# Patient Record
Sex: Female | Born: 1955 | Race: White | Hispanic: No | Marital: Married | Smoking: Never smoker
Health system: Southern US, Community
[De-identification: ages and names within clinical notes are randomized; demographics above are authoritative.]

## PROBLEM LIST (undated history)

## (undated) DIAGNOSIS — E785 Hyperlipidemia, unspecified: Secondary | ICD-10-CM

## (undated) DIAGNOSIS — Z800 Family history of malignant neoplasm of digestive organs: Secondary | ICD-10-CM

## (undated) DIAGNOSIS — N951 Menopausal and female climacteric states: Secondary | ICD-10-CM

## (undated) DIAGNOSIS — E041 Nontoxic single thyroid nodule: Secondary | ICD-10-CM

## (undated) HISTORY — PX: COLONOSCOPY: SHX174

## (undated) HISTORY — DX: Family history of malignant neoplasm of digestive organs: Z80.0

## (undated) HISTORY — PX: TONSILLECTOMY: SUR1361

## (undated) HISTORY — DX: Hyperlipidemia, unspecified: E78.5

## (undated) HISTORY — DX: Menopausal and female climacteric states: N95.1

## (undated) HISTORY — PX: OTHER SURGICAL HISTORY: SHX169

## (undated) HISTORY — DX: Nontoxic single thyroid nodule: E04.1

---

## 1998-02-16 ENCOUNTER — Other Ambulatory Visit: Admission: RE | Admit: 1998-02-16 | Discharge: 1998-02-16 | Payer: Self-pay | Admitting: Obstetrics and Gynecology

## 1999-06-18 ENCOUNTER — Other Ambulatory Visit: Admission: RE | Admit: 1999-06-18 | Discharge: 1999-06-18 | Payer: Self-pay | Admitting: Obstetrics and Gynecology

## 1999-08-28 ENCOUNTER — Encounter: Admission: RE | Admit: 1999-08-28 | Discharge: 1999-08-28 | Payer: Self-pay | Admitting: Obstetrics and Gynecology

## 1999-08-28 ENCOUNTER — Encounter: Payer: Self-pay | Admitting: Obstetrics and Gynecology

## 2000-06-26 ENCOUNTER — Other Ambulatory Visit: Admission: RE | Admit: 2000-06-26 | Discharge: 2000-06-26 | Payer: Self-pay | Admitting: Obstetrics and Gynecology

## 2000-09-02 ENCOUNTER — Encounter: Payer: Self-pay | Admitting: Obstetrics and Gynecology

## 2000-09-02 ENCOUNTER — Encounter: Admission: RE | Admit: 2000-09-02 | Discharge: 2000-09-02 | Payer: Self-pay | Admitting: Obstetrics and Gynecology

## 2001-09-03 ENCOUNTER — Encounter: Admission: RE | Admit: 2001-09-03 | Discharge: 2001-09-03 | Payer: Self-pay | Admitting: Internal Medicine

## 2001-09-03 ENCOUNTER — Encounter: Payer: Self-pay | Admitting: Obstetrics and Gynecology

## 2002-09-06 ENCOUNTER — Encounter: Admission: RE | Admit: 2002-09-06 | Discharge: 2002-09-06 | Payer: Self-pay | Admitting: Obstetrics and Gynecology

## 2002-09-06 ENCOUNTER — Encounter: Payer: Self-pay | Admitting: Obstetrics and Gynecology

## 2003-01-08 DIAGNOSIS — E041 Nontoxic single thyroid nodule: Secondary | ICD-10-CM

## 2003-01-08 HISTORY — PX: OTHER SURGICAL HISTORY: SHX169

## 2003-01-08 HISTORY — DX: Nontoxic single thyroid nodule: E04.1

## 2003-03-21 ENCOUNTER — Encounter: Payer: Self-pay | Admitting: Internal Medicine

## 2003-07-14 ENCOUNTER — Ambulatory Visit (HOSPITAL_COMMUNITY): Admission: RE | Admit: 2003-07-14 | Discharge: 2003-07-14 | Payer: Self-pay | Admitting: Obstetrics and Gynecology

## 2003-07-19 ENCOUNTER — Ambulatory Visit (HOSPITAL_COMMUNITY): Admission: RE | Admit: 2003-07-19 | Discharge: 2003-07-19 | Payer: Self-pay | Admitting: Obstetrics and Gynecology

## 2003-07-19 ENCOUNTER — Encounter (INDEPENDENT_AMBULATORY_CARE_PROVIDER_SITE_OTHER): Payer: Self-pay | Admitting: *Deleted

## 2003-08-22 ENCOUNTER — Observation Stay (HOSPITAL_COMMUNITY): Admission: RE | Admit: 2003-08-22 | Discharge: 2003-08-23 | Payer: Self-pay | Admitting: Surgery

## 2003-08-22 ENCOUNTER — Encounter (INDEPENDENT_AMBULATORY_CARE_PROVIDER_SITE_OTHER): Payer: Self-pay | Admitting: Specialist

## 2003-09-28 ENCOUNTER — Encounter: Admission: RE | Admit: 2003-09-28 | Discharge: 2003-09-28 | Payer: Self-pay | Admitting: Obstetrics and Gynecology

## 2004-06-20 ENCOUNTER — Ambulatory Visit: Payer: Self-pay | Admitting: Internal Medicine

## 2005-01-15 ENCOUNTER — Encounter: Admission: RE | Admit: 2005-01-15 | Discharge: 2005-01-15 | Payer: Self-pay | Admitting: Obstetrics and Gynecology

## 2005-07-03 ENCOUNTER — Ambulatory Visit: Payer: Self-pay | Admitting: Internal Medicine

## 2005-07-31 ENCOUNTER — Ambulatory Visit: Payer: Self-pay | Admitting: Internal Medicine

## 2005-09-11 ENCOUNTER — Ambulatory Visit: Payer: Self-pay | Admitting: Internal Medicine

## 2006-01-16 ENCOUNTER — Encounter: Admission: RE | Admit: 2006-01-16 | Discharge: 2006-01-16 | Payer: Self-pay | Admitting: Obstetrics and Gynecology

## 2006-01-24 ENCOUNTER — Encounter: Admission: RE | Admit: 2006-01-24 | Discharge: 2006-01-24 | Payer: Self-pay | Admitting: Obstetrics and Gynecology

## 2006-08-05 ENCOUNTER — Ambulatory Visit: Payer: Self-pay | Admitting: Internal Medicine

## 2006-08-05 DIAGNOSIS — E785 Hyperlipidemia, unspecified: Secondary | ICD-10-CM

## 2006-08-10 ENCOUNTER — Encounter: Payer: Self-pay | Admitting: Internal Medicine

## 2006-08-11 ENCOUNTER — Encounter (INDEPENDENT_AMBULATORY_CARE_PROVIDER_SITE_OTHER): Payer: Self-pay | Admitting: *Deleted

## 2007-01-22 ENCOUNTER — Encounter: Admission: RE | Admit: 2007-01-22 | Discharge: 2007-01-22 | Payer: Self-pay | Admitting: Obstetrics and Gynecology

## 2007-03-16 ENCOUNTER — Ambulatory Visit: Payer: Self-pay | Admitting: Internal Medicine

## 2007-08-24 ENCOUNTER — Encounter (INDEPENDENT_AMBULATORY_CARE_PROVIDER_SITE_OTHER): Payer: Self-pay | Admitting: *Deleted

## 2007-08-24 ENCOUNTER — Ambulatory Visit: Payer: Self-pay | Admitting: Internal Medicine

## 2007-08-24 DIAGNOSIS — Z8639 Personal history of other endocrine, nutritional and metabolic disease: Secondary | ICD-10-CM

## 2007-08-31 ENCOUNTER — Encounter (INDEPENDENT_AMBULATORY_CARE_PROVIDER_SITE_OTHER): Payer: Self-pay | Admitting: *Deleted

## 2007-09-21 ENCOUNTER — Telehealth (INDEPENDENT_AMBULATORY_CARE_PROVIDER_SITE_OTHER): Payer: Self-pay | Admitting: *Deleted

## 2008-02-16 ENCOUNTER — Encounter: Admission: RE | Admit: 2008-02-16 | Discharge: 2008-02-16 | Payer: Self-pay | Admitting: Obstetrics and Gynecology

## 2008-03-08 ENCOUNTER — Telehealth (INDEPENDENT_AMBULATORY_CARE_PROVIDER_SITE_OTHER): Payer: Self-pay | Admitting: *Deleted

## 2008-09-19 ENCOUNTER — Ambulatory Visit: Payer: Self-pay | Admitting: Internal Medicine

## 2008-09-21 ENCOUNTER — Encounter: Admission: RE | Admit: 2008-09-21 | Discharge: 2008-09-21 | Payer: Self-pay | Admitting: Internal Medicine

## 2008-09-22 ENCOUNTER — Encounter (INDEPENDENT_AMBULATORY_CARE_PROVIDER_SITE_OTHER): Payer: Self-pay | Admitting: *Deleted

## 2008-09-22 ENCOUNTER — Telehealth (INDEPENDENT_AMBULATORY_CARE_PROVIDER_SITE_OTHER): Payer: Self-pay | Admitting: *Deleted

## 2008-09-26 ENCOUNTER — Telehealth (INDEPENDENT_AMBULATORY_CARE_PROVIDER_SITE_OTHER): Payer: Self-pay | Admitting: *Deleted

## 2008-09-27 ENCOUNTER — Ambulatory Visit: Payer: Self-pay | Admitting: Internal Medicine

## 2008-12-06 ENCOUNTER — Telehealth (INDEPENDENT_AMBULATORY_CARE_PROVIDER_SITE_OTHER): Payer: Self-pay | Admitting: *Deleted

## 2008-12-06 ENCOUNTER — Ambulatory Visit: Payer: Self-pay | Admitting: Internal Medicine

## 2008-12-13 LAB — CONVERTED CEMR LAB
ALT: 10 units/L (ref 0–35)
AST: 20 units/L (ref 0–37)
Albumin: 3.4 g/dL — ABNORMAL LOW (ref 3.5–5.2)
Direct LDL: 113.2 mg/dL
HDL: 87.7 mg/dL (ref >39.00)
Total Bilirubin: 0.6 mg/dL (ref 0.3–1.2)
Total Protein: 6.2 g/dL (ref 6.0–8.3)
Triglycerides: 54 mg/dL (ref 0.0–149.0)
VLDL: 10.8 mg/dL (ref 0.0–40.0)

## 2009-01-26 ENCOUNTER — Encounter: Payer: Self-pay | Admitting: Internal Medicine

## 2009-02-16 ENCOUNTER — Encounter: Admission: RE | Admit: 2009-02-16 | Discharge: 2009-02-16 | Payer: Self-pay | Admitting: Obstetrics and Gynecology

## 2009-02-16 IMAGING — MG MM DIGITAL SCREENING
4 series · 4 of 4 positions shown · non-contrast
Comparison: none

DG SCREEN MAMMOGRAM BILATERAL
Bilateral CC and MLO view(s) were taken.

DIGITAL SCREENING MAMMOGRAM WITH CAD:
The breast tissue is extremely dense.  No masses or malignant type calcifications are identified.  
Compared with prior studies.
Images were processed with CAD.

[R CC]
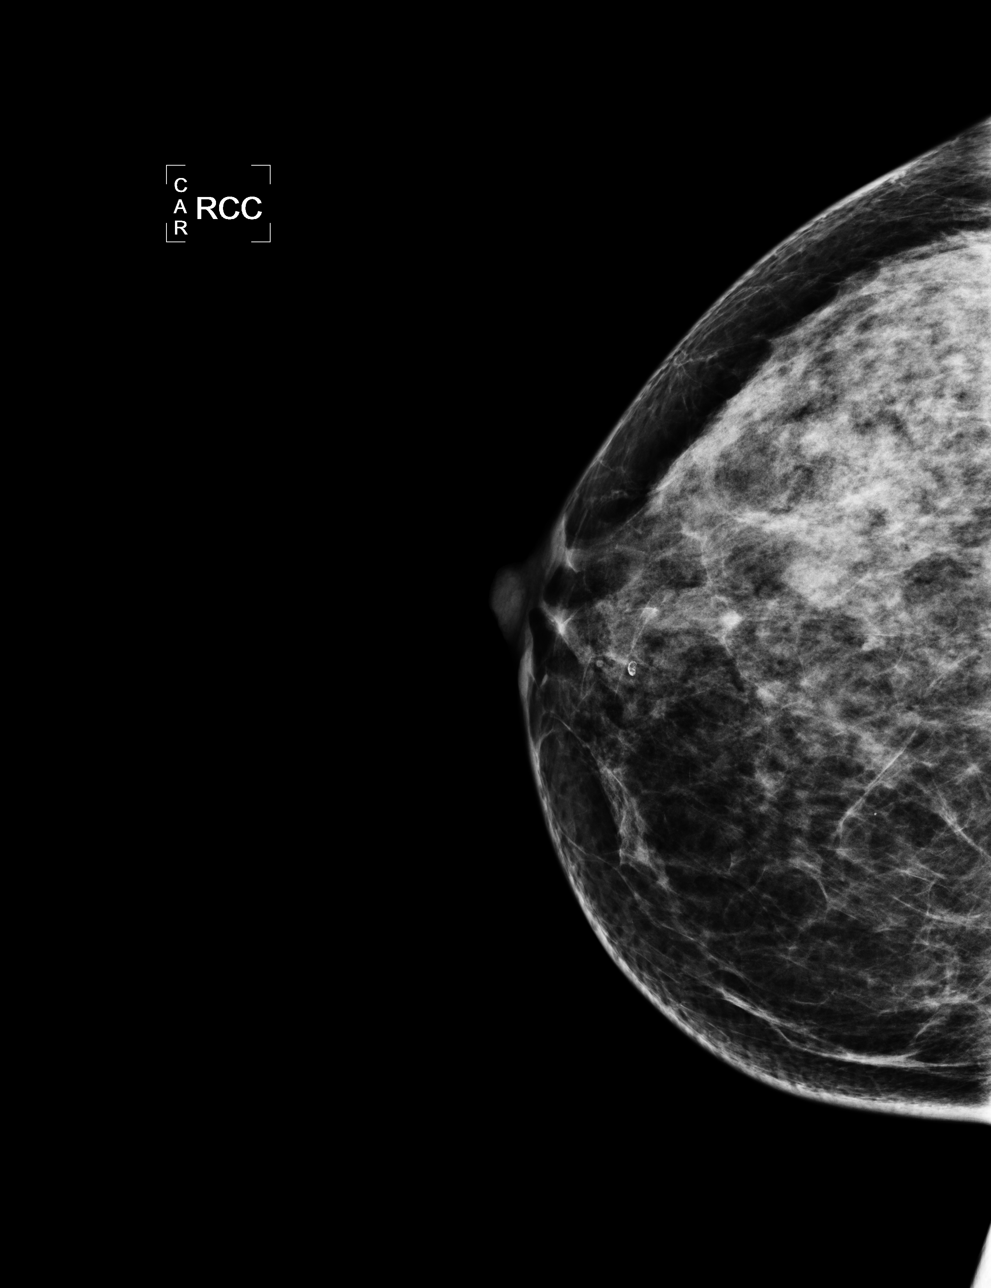

[L CC]
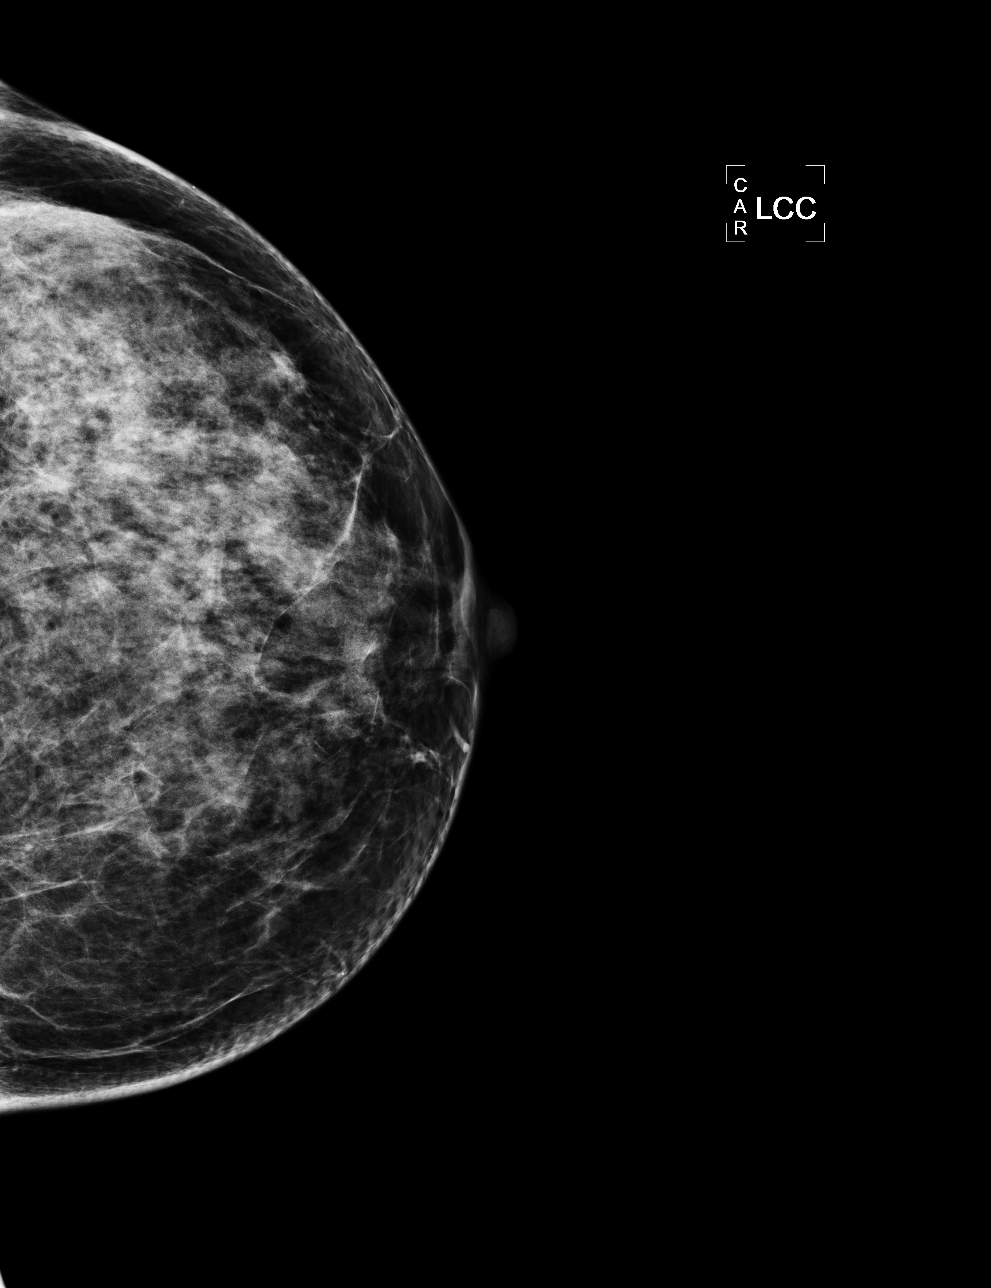

[L MLO]
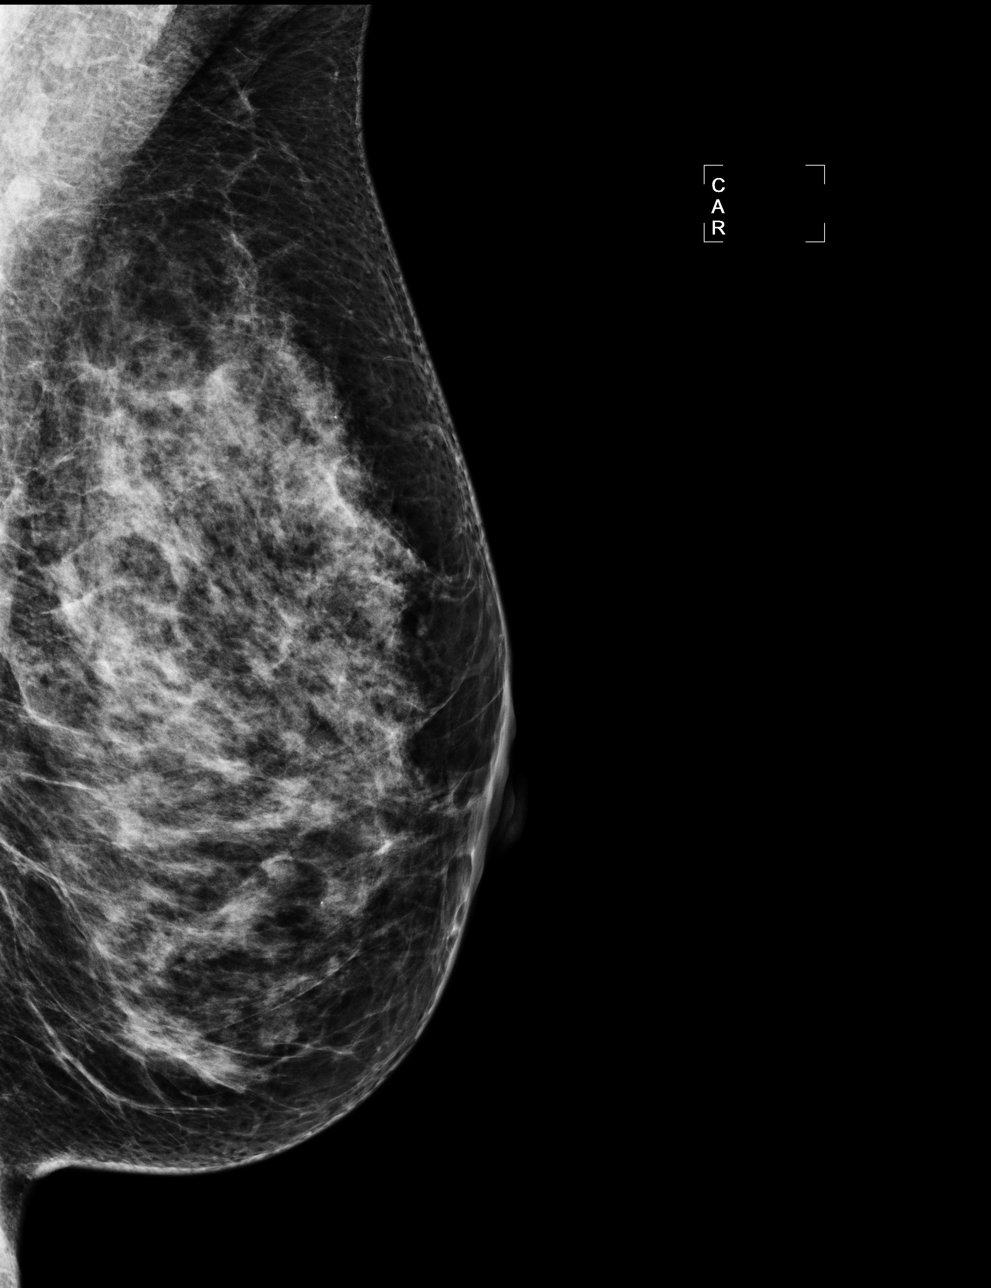

[R MLO]
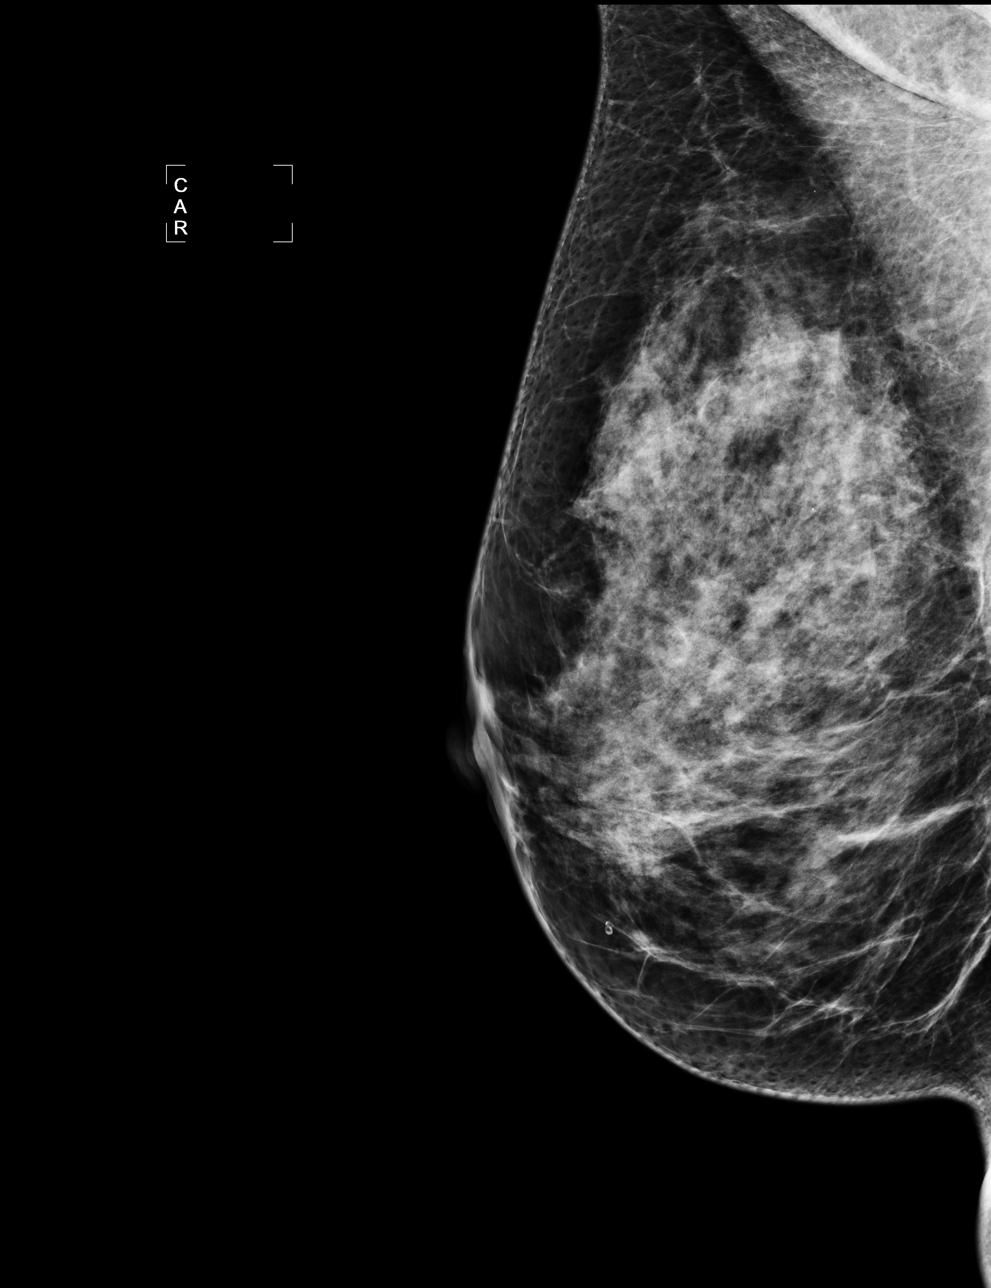

[4 of 4 positions shown; findings below may reference images not displayed]

IMPRESSION: No specific mammographic evidence of malignancy.  Next screening mammogram is recommended in one 
year.

A result letter of this screening mammogram will be mailed directly to the patient.

ASSESSMENT: Negative - BI-RADS 1

Screening mammogram in 1 year.
,

## 2009-10-04 ENCOUNTER — Ambulatory Visit: Payer: Self-pay | Admitting: Internal Medicine

## 2009-10-04 ENCOUNTER — Encounter: Payer: Self-pay | Admitting: Internal Medicine

## 2009-10-04 DIAGNOSIS — N951 Menopausal and female climacteric states: Secondary | ICD-10-CM | POA: Insufficient documentation

## 2009-10-05 LAB — CONVERTED CEMR LAB
ALT: 17 units/L (ref 0–35)
AST: 22 units/L (ref 0–37)
Alkaline Phosphatase: 55 units/L (ref 39–117)
Basophils Absolute: 0 10*3/uL (ref 0.0–0.1)
Basophils Relative: 0.7 % (ref 0.0–3.0)
CO2: 30 meq/L (ref 19–32)
Calcium: 8.9 mg/dL (ref 8.4–10.5)
Chloride: 102 meq/L (ref 96–112)
Creatinine, Ser: 0.7 mg/dL (ref 0.4–1.2)
Eosinophils Relative: 1.6 % (ref 0.0–5.0)
GFR calc non Af Amer: 96.83 mL/min (ref >60)
HCT: 39.7 % (ref 36.0–46.0)
Hemoglobin: 13.4 g/dL (ref 12.0–15.0)
MCV: 91.7 fL (ref 78.0–100.0)
Platelets: 302 10*3/uL (ref 150.0–400.0)
RBC: 4.32 M/uL (ref 3.87–5.11)
RDW: 13.4 % (ref 11.5–14.6)
Total Bilirubin: 0.5 mg/dL (ref 0.3–1.2)
Total CHOL/HDL Ratio: 3
Triglycerides: 79 mg/dL (ref 0.0–149.0)
WBC: 3.9 10*3/uL — ABNORMAL LOW (ref 4.5–10.5)

## 2009-10-16 ENCOUNTER — Telehealth (INDEPENDENT_AMBULATORY_CARE_PROVIDER_SITE_OTHER): Payer: Self-pay | Admitting: *Deleted

## 2010-01-27 ENCOUNTER — Other Ambulatory Visit: Payer: Self-pay | Admitting: Obstetrics and Gynecology

## 2010-01-27 DIAGNOSIS — Z1239 Encounter for other screening for malignant neoplasm of breast: Secondary | ICD-10-CM

## 2010-01-28 ENCOUNTER — Encounter: Payer: Self-pay | Admitting: Obstetrics and Gynecology

## 2010-02-04 LAB — CONVERTED CEMR LAB
ALT: 11 units/L (ref 0–35)
ALT: 12 units/L (ref 0–35)
AST: 11 units/L (ref 0–37)
AST: 18 units/L (ref 0–37)
Albumin: 3.2 g/dL — ABNORMAL LOW (ref 3.5–5.2)
Alkaline Phosphatase: 48 units/L (ref 39–117)
BUN: 7 mg/dL (ref 6–23)
BUN: 7 mg/dL (ref 6–23)
Basophils Absolute: 0 10*3/uL (ref 0.0–0.1)
Basophils Absolute: 0 10*3/uL (ref 0.0–0.1)
Basophils Absolute: 0 10*3/uL (ref 0.0–0.1)
Basophils Relative: 0.6 % (ref 0.0–1.0)
Basophils Relative: 0.6 % (ref 0.0–3.0)
Bilirubin Urine: NEGATIVE
Bilirubin, Direct: 0 mg/dL (ref 0.0–0.3)
Bilirubin, Direct: 0.1 mg/dL (ref 0.0–0.3)
CO2: 30 meq/L (ref 19–32)
CO2: 31 meq/L (ref 19–32)
Calcium: 8.5 mg/dL (ref 8.4–10.5)
Calcium: 8.9 mg/dL (ref 8.4–10.5)
Chloride: 102 meq/L (ref 96–112)
Cholesterol: 268 mg/dL (ref 0–200)
Cholesterol: 297 mg/dL — ABNORMAL HIGH (ref 0–200)
Creatinine, Ser: 0.8 mg/dL (ref 0.4–1.2)
Eosinophils Absolute: 0 10*3/uL (ref 0.0–0.6)
Eosinophils Absolute: 0.1 10*3/uL (ref 0.0–0.7)
Eosinophils Absolute: 0.1 10*3/uL (ref 0.0–0.7)
Eosinophils Relative: 0.7 % (ref 0.0–5.0)
Eosinophils Relative: 1.2 % (ref 0.0–5.0)
GFR calc Af Amer: 113 mL/min
GFR calc Af Amer: 113 mL/min
GFR calc non Af Amer: 93 mL/min
Glucose, Bld: 87 mg/dL (ref 70–99)
Glucose, Bld: 88 mg/dL (ref 70–99)
HCT: 38 % (ref 36.0–46.0)
HCT: 42.3 % (ref 36.0–46.0)
HDL: 66.5 mg/dL (ref >39.0)
HDL: 84.5 mg/dL (ref >39.00)
Hemoglobin: 13.2 g/dL (ref 12.0–15.0)
Hemoglobin: 13.8 g/dL (ref 12.0–15.0)
LDL Goal: 130 mg/dL
Leukocytes, UA: NEGATIVE
Lymphocytes Relative: 30.1 % (ref 12.0–46.0)
Lymphocytes Relative: 32.8 % (ref 12.0–46.0)
MCHC: 33.6 g/dL (ref 30.0–36.0)
MCV: 86.1 fL (ref 78.0–100.0)
MCV: 88.7 fL (ref 78.0–100.0)
MCV: 89.2 fL (ref 78.0–100.0)
Monocytes Relative: 7.7 % (ref 3.0–12.0)
Monocytes Relative: 9.8 % (ref 3.0–12.0)
Neutrophils Relative %: 55.4 % (ref 43.0–77.0)
Nitrite: NEGATIVE
Platelets: 279 10*3/uL (ref 150.0–400.0)
Platelets: 318 10*3/uL (ref 150–400)
Potassium: 3.8 meq/L (ref 3.5–5.1)
Potassium: 4 meq/L (ref 3.5–5.1)
Potassium: 4.1 meq/L (ref 3.5–5.1)
RBC: 4.77 M/uL (ref 3.87–5.11)
RDW: 12.8 % (ref 11.5–14.6)
Sodium: 137 meq/L (ref 135–145)
TSH: 1.48 microintl units/mL (ref 0.35–5.50)
Total Bilirubin: 0.9 mg/dL (ref 0.3–1.2)
Total CHOL/HDL Ratio: 3.1
Total Protein: 5.9 g/dL — ABNORMAL LOW (ref 6.0–8.3)
Total Protein: 5.9 g/dL — ABNORMAL LOW (ref 6.0–8.3)
Triglycerides: 72 mg/dL (ref 0.0–149.0)
Urine Glucose: NEGATIVE mg/dL
VLDL: 14.4 mg/dL (ref 0.0–40.0)
VLDL: 16 mg/dL (ref 0–40)
VLDL: 21 mg/dL (ref 0–40)
pH: 7 (ref 5.0–8.0)

## 2010-02-06 NOTE — Progress Notes (Signed)
Summary: Questions about meds  Phone Note Call from Patient   Caller: Patient Details for Reason: Questions about results Summary of Call: spk wit pt and she rcv'd results, wanted to know if she should continue meds. Adv yes to continue meds as directed until further directed by Dr.Hopper. Pt voiced understanding. Initial call taken by: Almeta Monas CMA Duncan Dull),  October 16, 2009 3:03 PM

## 2010-02-06 NOTE — Consult Note (Signed)
Summary: [facility]  [facility]   Imported By: Lanelle Bal 02/03/2009 09:42:28  _____________________________________________________________________  External Attachment:    Type:   Image     Comment:   External Document

## 2010-02-06 NOTE — Assessment & Plan Note (Signed)
Summary: CPX & LAB/CBS   Vital Signs:  Patient profile:   55 year old female Height:      65 inches Weight:      132.6 pounds BMI:     22.15 Temp:     98.5 degrees F oral Pulse rate:   76 / minute Resp:     14 per minute BP sitting:   100 / 64  (left arm) Cuff size:   regular  Vitals Entered By: Terri Pena CMA (October 04, 2009 9:52 AM)    History of Present Illness:    Terri Pena is here for a physical; she is essentially asymptomatic.    She also  presents for Hyperlipidemia follow-up.  The patient denies muscle aches, GI upset, abdominal pain, flushing, itching, constipation, diarrhea, and fatigue.  The patient denies the following symptoms: chest pain/pressure, exercise intolerance, dypsnea, palpitations, syncope, and pedal edema.  Compliance with medications (by patient report) has been near 100%.  Dietary compliance has been good.    Lipid Management History:      Positive NCEP/ATP III risk factors include female age 31 years old or older.  Negative NCEP/ATP III risk factors include non-diabetic, HDL cholesterol greater than 60, no family history for ischemic heart disease, non-tobacco-user status, non-hypertensive, no ASHD (atherosclerotic heart disease), no prior stroke/TIA, no peripheral vascular disease, and no history of aortic aneurysm.     Current Medications (verified): 1)  Pravachol 40 Mg Tabs (Pravastatin Sodium) .... Take One Tablet Daily  Allergies: 1)  ! Sulfa  Past History:  Past Medical History: G2 P2( both C sections) Hyperlipidemia: NMR Lipoprofile 2005: LDL 170(1356/ 117), HDL 80, TG 76.LDL  goal =< 160, ideally < 130.  [place] Study LDL goal = < 160. Cervical Radiculopathy-Left, quiescent TMJ Thyroid nodule, S/P surgery, benign follicular cell lesion, Dr Jamey Ripa  Past Surgical History: thyroid nodule resected (1 lobe removed) 2005, Dr Jamey Ripa Colonoscopy 2004 & 2011 negative , (now seeing Dr Loreta Ave) Tonsillectomy Ganglionectomy; C section X  2  Family History: Father: alcoholism Mother: colon polyps Siblings:bro :acromegaly;   MGM: colon  cancer, CAD; 3  M uncles: prostate cancer;MGF : prostate cancer; no FH MI  Social History: decreased fried foods & sweets Married Never Smoked Alcohol use-no Regular exercise-yes: 2 mpd  2 X/week Occupation: Customer service manager  Review of Systems  The patient denies anorexia, fever, weight loss, weight gain, vision loss, decreased hearing, hoarseness, prolonged cough, headaches, melena, hematochezia, severe indigestion/heartburn, hematuria, suspicious skin lesions, depression, unusual weight change, abnormal bleeding, enlarged lymph nodes, and angioedema.         Having night sweats; FSH was 123 in 2010.  Physical Exam  General:  Thin,well-nourished;alert,appropriate and cooperative throughout examination Head:  Normocephalic and atraumatic without obvious abnormalities.  Eyes:  No corneal or conjunctival inflammation noted. Perrla. Funduscopic exam benign, without hemorrhages, exudates or papilledema.  Ears:  External ear exam shows no significant lesions or deformities.  Otoscopic examination reveals clear canals, tympanic membranes are intact bilaterally without bulging, retraction, inflammation or discharge. Hearing is grossly normal bilaterally. Nose:  External nasal examination shows no deformity or inflammation. Nasal mucosa are pink and moist without lesions or exudates. Mouth:  Oral mucosa and oropharynx without lesions or exudates.  Teeth in good repair. Neck:  No deformities, masses, or tenderness noted. ? absent L lobe  Lungs:  Normal respiratory effort, chest expands symmetrically. Lungs are clear to auscultation, no crackles or wheezes. Heart:  Normal rate and regular rhythm. S1 and S2  normal without gallop, murmur, click, rub . S4 Abdomen:  Bowel sounds positive,abdomen soft and non-tender without masses, organomegaly or hernias noted. Aorta palpable w/o AAA Genitalia:   Dr. Ambrose Mantle Msk:  No deformity or scoliosis noted of thoracic or lumbar spine.   Pulses:  R and L carotid,radial,dorsalis pedis and posterior tibial pulses are full and equal bilaterally Extremities:  No clubbing, cyanosis, edema, or deformity noted with normal full range of motion of all joints.   Neurologic:  alert & oriented X3 and DTRs symmetrical and normal.   Skin:  Intact without suspicious lesions or rashes.  Faint erythema @ neck which blanches with pressure Cervical Nodes:  No lymphadenopathy noted Axillary Nodes:  No palpable lymphadenopathy Psych:  memory intact for recent and remote, normally interactive, and good eye contact.     Impression & Recommendations:  Problem # 1:  ROUTINE GENERAL MEDICAL EXAM@HEALTH  CARE FACL (ICD-V70.0)  Orders: EKG w/ Interpretation (93000) Venipuncture (16109) TLB-Lipid Panel (80061-LIPID) TLB-BMP (Basic Metabolic Panel-BMET) (80048-METABOL) TLB-CBC Platelet - w/Differential (85025-CBCD) TLB-Hepatic/Liver Function Pnl (80076-HEPATIC) TLB-TSH (Thyroid Stimulating Hormone) (84443-TSH)  Problem # 2:  HYPERLIPIDEMIA NEC/NOS (ICD-272.4)  Her updated medication list for this problem includes:    Pravachol 40 Mg Tabs (Pravastatin sodium) .Marland Kitchen... 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 Take one tablet daily 2)  Citalopram Hydrobromide 20 Mg Tabs (Citalopram hydrobromide) .Marland Kitchen.. 1 once daily  Lipid Assessment/Plan:      Based on NCEP/ATP III, the patient's risk factor category is "0-1 risk factors".  The patient's lipid goals are as follows: Total cholesterol goal is 200; LDL cholesterol goal is 130; HDL cholesterol goal is 50; Triglyceride goal is 150.  Her LDL cholesterol goal has been met.    Patient Instructions: 1)  It is important that you exercise  regularly at least 20 minutes 5 times a week. If you develop chest pain, have severe difficulty breathing, or feel very tired , stop exercising immediately and seek medical attention. 2)  Take an  81 mg coated Aspirin every day. Prescriptions: CITALOPRAM HYDROBROMIDE 20 MG TABS (CITALOPRAM HYDROBROMIDE) 1 once daily  #30 x 5   Entered and Authorized by:   Marga Melnick MD   Signed by:   Marga Melnick MD on 10/04/2009   Method used:   Print then Give to Patient   RxID:   [id number] PRAVACHOL 40 MG TABS (PRAVASTATIN SODIUM) take one tablet daily  #90 x 3   Entered and Authorized by:   Marga Melnick MD   Signed by:   Marga Melnick MD on 10/04/2009   Method used:   Print then Give to Patient   RxID:   [id number]     Appended Document: CPX & LAB/CBS Flu Vaccine Consent Questions     Do you have a history of severe allergic reactions to this vaccine? no    Any prior history of allergic reactions to egg and/or gelatin? no    Do you have a sensitivity to the preservative Thimersol? no    Do you have a past history of Guillan-Barre Syndrome? no    Do you currently have an acute febrile illness? no    Have you ever had a severe reaction to latex? no    Vaccine information given and explained to patient? yes    Are you currently pregnant?  no    Lot Number:[id number]   Exp Date:07/07/2010   Site Given  Right Deltoid IM    Appended Document: CPX & LAB/CBS

## 2010-02-19 ENCOUNTER — Ambulatory Visit: Payer: Self-pay

## 2010-02-28 ENCOUNTER — Ambulatory Visit
Admission: RE | Admit: 2010-02-28 | Discharge: 2010-02-28 | Disposition: A | Discharge status: Home / Self Care | Payer: BC Managed Care – PPO | Source: Ambulatory Visit | Attending: Obstetrics and Gynecology | Admitting: Obstetrics and Gynecology

## 2010-02-28 DIAGNOSIS — Z1239 Encounter for other screening for malignant neoplasm of breast: Secondary | ICD-10-CM

## 2010-05-25 NOTE — Op Note (Signed)
Terri Pena, Terri Pena                              ACCOUNT NO.:  [id number]   MEDICAL RECORD NO.:  [id number]                   PATIENT TYPE:  OBV   LOCATION:  0447                                 FACILITY:  [facility]   PHYSICIAN:  Currie Paris, M.D.           DATE OF BIRTH:  Apr 05, 1955   DATE OF PROCEDURE:  08/22/2003  DATE OF DISCHARGE:                                 OPERATIVE REPORT   CCS NUMBER:  [id number].   PREOPERATIVE DIAGNOSIS:  Left thyroid nodule.   POSTOPERATIVE DIAGNOSIS:  Left thyroid nodule, benign follicular cell lesion  by frozen section.   OPERATION:  Left thyroid lobectomy.   SURGEON:  Currie Paris, M.D.   ASSISTANT:  Ollen Gross. Carolynne Edouard, M.D.   ANESTHESIA:  General endotracheal.   CLINICAL HISTORY:  This 55 year old lady recently was found to have a left  thyroid nodule on routine exam.  She was asymptomatic.  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   DESCRIPTION OF PROCEDURE:  The patient was seen in the holding area, and she  had no further questions.  She was taken to the operating room and after  satisfactory general endotracheal anesthesia obtained, the neck was prepped  and draped.  Thyroid nodule was palpated prior to beginning.  The cervical  incision was made transversely about one fingerbreadth above the clavicular  heads.  The platysma was divided, and sub-platysmal flaps were raised.  A  self-retaining retractor was placed.  The pre-thyroid fascia was opened in  the midline.  The left lobe of the thyroid was mobilized.  I was able to get  good visualizations of the superior pole vessels and divided those, ligating  them doubly on the proximal side and then rotating the thyroid a little bit  more medially.  There was a good-sized nodule on the inferior pole.  Sitting  up on the thyroid capsule, I safely divided several small vessels and push  what I thought might have been an inferior parathyroid back as well as some  fatty tissue and rotate the thyroid more medially.  I continued to work on  the thyroid capsule.  I was able to continue to rotate the thyroid medially  and push the remaining neck structures laterally.  I was able to dissect  into the area of the esophagotracheal groove and identify the path of the  recurrent laryngeal.  We stayed well away from that and more medially.  The  secure parathyroid gland was also identified, and I was able to extend the  thyroid side of that, divide some vessels, and sweep that laterally as well.  We primarily used small clips for small vessels and then cautery on some of  the more adventitial type of tissue.  Eventually, the thyroid band was  rotated up, off, and to the midline of the trachea.  We had it completely  freed up.  It was clamped a couple of times to keep taking the isthmus along  with the left lobe and divided and sent for frozen section.  The thyroid  isthmus was suture ligated with some 4-0 Vicryl.  We irrigated it to make  sure everything was dry and checked the area of the nerve, which appeared  okay.  Checked the one parathyroid that was clearly viable, and the other  one appeared also viable.   After this, we closed the neck with some 3-0 Vicryl to close the pre-thyroid  fascia, Vicryl on the platysma, and staples on the skin.   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  On a few slides, they were not able to  absolutely rule out malignancy.   Since we have no diagnosis of cancer, we would like to, as planned, stop the  procedure at this point.  She tolerated the procedure well, and there were  no intraoperative complications.  All counts are correct.                                               Currie Paris, M.D.    CJS/MEDQ  D:  08/22/2003  T:  08/22/2003  Job:  [id number]   cc:   Malachi Pro. Ambrose Mantle, M.D.  [address]  [address]  [place] [address]  Fax:  [phone]   Titus Dubin. Alwyn Ren, M.D. [facility]

## 2010-10-09 ENCOUNTER — Encounter: Payer: Self-pay | Admitting: Internal Medicine

## 2010-10-09 ENCOUNTER — Ambulatory Visit (INDEPENDENT_AMBULATORY_CARE_PROVIDER_SITE_OTHER): Payer: BC Managed Care – PPO | Admitting: Internal Medicine

## 2010-10-09 DIAGNOSIS — Z862 Personal history of diseases of the blood and blood-forming organs and certain disorders involving the immune mechanism: Secondary | ICD-10-CM

## 2010-10-09 DIAGNOSIS — Z0000 Encounter for general adult medical examination without abnormal findings: Secondary | ICD-10-CM

## 2010-10-09 DIAGNOSIS — Z8639 Personal history of other endocrine, nutritional and metabolic disease: Secondary | ICD-10-CM

## 2010-10-09 DIAGNOSIS — N959 Unspecified menopausal and perimenopausal disorder: Secondary | ICD-10-CM

## 2010-10-09 DIAGNOSIS — E785 Hyperlipidemia, unspecified: Secondary | ICD-10-CM

## 2010-10-09 DIAGNOSIS — Z23 Encounter for immunization: Secondary | ICD-10-CM

## 2010-10-09 LAB — BASIC METABOLIC PANEL
BUN: 9 mg/dL (ref 6–23)
CO2: 31 mEq/L (ref 19–32)
Creatinine, Ser: 0.6 mg/dL (ref 0.4–1.2)
Glucose, Bld: 91 mg/dL (ref 70–99)
Sodium: 141 mEq/L (ref 135–145)

## 2010-10-09 LAB — CBC WITH DIFFERENTIAL/PLATELET
Basophils Absolute: 0 10*3/uL (ref 0.0–0.1)
Basophils Relative: 0.4 % (ref 0.0–3.0)
HCT: 41.8 % (ref 36.0–46.0)
Hemoglobin: 13.9 g/dL (ref 12.0–15.0)
MCHC: 33.4 g/dL (ref 30.0–36.0)
MCV: 90.8 fl (ref 78.0–100.0)
Monocytes Absolute: 0.4 10*3/uL (ref 0.1–1.0)
Neutro Abs: 2.3 10*3/uL (ref 1.4–7.7)
Neutrophils Relative %: 50.1 % (ref 43.0–77.0)
RDW: 14.1 % (ref 11.5–14.6)
WBC: 4.6 10*3/uL (ref 4.5–10.5)

## 2010-10-09 LAB — LIPID PANEL
Total CHOL/HDL Ratio: 3
Triglycerides: 75 mg/dL (ref 0.0–149.0)
VLDL: 15 mg/dL (ref 0.0–40.0)

## 2010-10-09 LAB — HEPATIC FUNCTION PANEL
AST: 21 U/L (ref 0–37)
Alkaline Phosphatase: 49 U/L (ref 39–117)
Bilirubin, Direct: 0 mg/dL (ref 0.0–0.3)
Total Bilirubin: 0.7 mg/dL (ref 0.3–1.2)

## 2010-10-09 LAB — LDL CHOLESTEROL, DIRECT: Direct LDL: 118.9 mg/dL

## 2010-10-09 NOTE — Patient Instructions (Signed)
Preventive Health Care: Exercise  30-45  minutes a day, 3-4 days a week. Walking is especially valuable in preventing Osteoporosis. Eat a low-fat diet with lots of fruits and vegetables, up to 7-9 servings per day. Consume less than 30 grams of sugar per day from foods & drinks with High Fructose Corn Syrup as #2,3 or #4 on label. Health Care Power of Attorney & Living Will place you in charge of your health care  decisions. Verify these are  in place.  

## 2010-10-09 NOTE — Progress Notes (Signed)
Subjective:    Patient ID: Terri Pena, female    DOB: 09/24/1955, 55 y.o.   MRN: [id number]  HPI  Terri Pena  is here for a physical;acute issues include persistant  numbness in the L index finger. If she touches the veins in the left wrist she notes tingling along the medial aspect of the left index finger .      Review of Systems Patient reports no vision/ hearing  changes, adenopathy,fever, weight change,  persistant / recurrent hoarseness , swallowing issues, chest pain,palpitations,edema,persistant /recurrent cough, hemoptysis, dyspnea( rest/ exertional/paroxysmal nocturnal), gastrointestinal bleeding(melena, rectal bleeding), abdominal pain, significant heartburn,  bowel changes,GU symptoms(dysuria, hematuria,pyuria, incontinence), Gyn symptoms(abnormal  bleeding , pain),  syncope, focal weakness, memory loss, skin/hair /nail changes,abnormal bruising or bleeding, anxiety,or depression.      Objective:   Physical Exam Gen.: Thin but healthy and well-nourished in appearance. Alert, appropriate and cooperative throughout exam. Head: Normocephalic without obvious abnormalities Eyes: No corneal or conjunctival inflammation noted. Pupils equal round reactive to light and accommodation. Fundal exam is benign without hemorrhages, exudate, papilledema. Extraocular motion intact. Vision slightly decreased reading wall chart w/o lenses . Ears: External  ear exam reveals no significant lesions or deformities. Canals clear .TMs normal. Hearing is grossly normal bilaterally. Nose: External nasal exam reveals no deformity or inflammation. Nasal mucosa are pink and moist. No lesions or exudates noted.   Mouth: Oral mucosa and oropharynx reveal no lesions or exudates. Teeth in good repair. Neck: No deformities, masses, or tenderness noted. Range of motion normal. Thyroid L lobe absent; R normal. Lungs: Normal respiratory effort; chest expands symmetrically. Lungs are clear to auscultation without rales,  wheezes, or increased work of breathing. Heart: Normal rate and rhythm. Normal S1 and S2. No gallop,  or rub. No murmur; S 4 vs slight apical click w/o MR. Abdomen: Bowel sounds normal; abdomen soft and nontender. No masses, organomegaly or hernias noted. Aorta palpable w/o AAA Genitalia: Dr Ambrose Mantle   .                                                                                   Musculoskeletal/extremities: No deformity or scoliosis noted of  the thoracic or lumbar spine. No clubbing, cyanosis, edema, or deformity noted. Range of motion  normal .Tone & strength  normal.Joints normal. Nail health  Good.Slight crepitus of knees Vascular: Carotid, radial artery, dorsalis pedis and  posterior tibial pulses are full and equal. No bruits present. Neurologic: Alert and oriented x3. Deep tendon reflexes symmetrical and normal.          Skin: Intact without suspicious lesions or rashes. Lymph: No cervical, axillary lymphadenopathy present. Psych: Mood and affect are normal. Normally interactive  Assessment & Plan:  #1 comprehensive  exam; no acute findings #2 see Problem List with Assessments & Recommendations  #3 she describes  some numbness and tingling in the left index fingermedially. There is no evidence of cervical radiculopathy based on strength and reflex exams. Plan: see Orders

## 2010-10-28 ENCOUNTER — Other Ambulatory Visit: Payer: Self-pay | Admitting: Internal Medicine

## 2010-11-02 ENCOUNTER — Ambulatory Visit (INDEPENDENT_AMBULATORY_CARE_PROVIDER_SITE_OTHER)
Admission: RE | Admit: 2010-11-02 | Discharge: 2010-11-02 | Disposition: A | Discharge status: Home / Self Care | Payer: BC Managed Care – PPO | Source: Ambulatory Visit

## 2010-11-02 DIAGNOSIS — Z0000 Encounter for general adult medical examination without abnormal findings: Secondary | ICD-10-CM

## 2010-11-02 DIAGNOSIS — Z13820 Encounter for screening for osteoporosis: Secondary | ICD-10-CM

## 2010-11-07 ENCOUNTER — Encounter: Payer: Self-pay | Admitting: Internal Medicine

## 2011-01-16 ENCOUNTER — Ambulatory Visit (INDEPENDENT_AMBULATORY_CARE_PROVIDER_SITE_OTHER): Payer: BC Managed Care – PPO | Admitting: *Deleted

## 2011-01-16 DIAGNOSIS — Z23 Encounter for immunization: Secondary | ICD-10-CM

## 2011-02-11 ENCOUNTER — Other Ambulatory Visit: Payer: Self-pay | Admitting: Obstetrics and Gynecology

## 2011-02-11 DIAGNOSIS — Z1231 Encounter for screening mammogram for malignant neoplasm of breast: Secondary | ICD-10-CM

## 2011-03-11 ENCOUNTER — Ambulatory Visit
Admission: RE | Admit: 2011-03-11 | Discharge: 2011-03-11 | Disposition: A | Discharge status: Home / Self Care | Payer: BC Managed Care – PPO | Source: Ambulatory Visit | Attending: Obstetrics and Gynecology | Admitting: Obstetrics and Gynecology

## 2011-03-11 DIAGNOSIS — Z1231 Encounter for screening mammogram for malignant neoplasm of breast: Secondary | ICD-10-CM

## 2011-07-17 ENCOUNTER — Other Ambulatory Visit: Payer: Self-pay | Admitting: Dermatology

## 2011-10-30 ENCOUNTER — Ambulatory Visit (INDEPENDENT_AMBULATORY_CARE_PROVIDER_SITE_OTHER): Payer: BC Managed Care – PPO | Admitting: Internal Medicine

## 2011-10-30 ENCOUNTER — Encounter: Payer: Self-pay | Admitting: Internal Medicine

## 2011-10-30 VITALS — BP 118/70 | HR 81 | Temp 98.5°F | Ht 65.03 in | Wt 135.8 lb

## 2011-10-30 DIAGNOSIS — M949 Disorder of cartilage, unspecified: Secondary | ICD-10-CM

## 2011-10-30 DIAGNOSIS — Z23 Encounter for immunization: Secondary | ICD-10-CM

## 2011-10-30 DIAGNOSIS — Z136 Encounter for screening for cardiovascular disorders: Secondary | ICD-10-CM

## 2011-10-30 DIAGNOSIS — Z0000 Encounter for general adult medical examination without abnormal findings: Secondary | ICD-10-CM

## 2011-10-30 DIAGNOSIS — M8580 Other specified disorders of bone density and structure, unspecified site: Secondary | ICD-10-CM

## 2011-10-30 LAB — HEPATIC FUNCTION PANEL
ALT: 15 U/L (ref 0–35)
Albumin: 3.1 g/dL — ABNORMAL LOW (ref 3.5–5.2)
Bilirubin, Direct: 0.1 mg/dL (ref 0.0–0.3)
Total Protein: 6.2 g/dL (ref 6.0–8.3)

## 2011-10-30 LAB — BASIC METABOLIC PANEL
CO2: 32 mEq/L (ref 19–32)
Chloride: 105 mEq/L (ref 96–112)
Potassium: 4.3 mEq/L (ref 3.5–5.1)
Sodium: 141 mEq/L (ref 135–145)

## 2011-10-30 LAB — CBC WITH DIFFERENTIAL/PLATELET
Basophils Relative: 0.8 % (ref 0.0–3.0)
Eosinophils Absolute: 0 10*3/uL (ref 0.0–0.7)
Eosinophils Relative: 1.2 % (ref 0.0–5.0)
HCT: 42.2 % (ref 36.0–46.0)
Hemoglobin: 13.7 g/dL (ref 12.0–15.0)
MCHC: 32.3 g/dL (ref 30.0–36.0)
MCV: 91.1 fl (ref 78.0–100.0)
Monocytes Absolute: 0.3 10*3/uL (ref 0.1–1.0)
Neutro Abs: 1.7 10*3/uL (ref 1.4–7.7)
RBC: 4.64 Mil/uL (ref 3.87–5.11)
WBC: 3.6 10*3/uL — ABNORMAL LOW (ref 4.5–10.5)

## 2011-10-30 LAB — LIPID PANEL
HDL: 82.2 mg/dL (ref >39.00)
Triglycerides: 54 mg/dL (ref 0.0–149.0)

## 2011-10-30 LAB — LDL CHOLESTEROL, DIRECT: Direct LDL: 170 mg/dL

## 2011-10-30 NOTE — Progress Notes (Signed)
  Subjective:    Patient ID: Terri Pena, female    DOB: 02-Feb-1955, 56 y.o.   MRN: [id number]  HPI  Terri Pena is here for a physical; she denies acute issues.      Review of Systems  She has been on a heart healthy diet; she has not been exercising regularly for several months. Previously she walked  6-8 miles per week.  With exercise she denies chest pain, palpitations, claudications, or dyspnea.  She has been compliant with her statin; she denies abdominal pain, bowel change, or myalgias.     Objective:   Physical Exam Gen.: Healthy and well-nourished in appearance. Alert, appropriate and cooperative throughout exam. Head: Normocephalic without obvious abnormalities Eyes: No corneal or conjunctival inflammation noted. Pupils equal round reactive to light and accommodation. Fundal exam is benign without hemorrhages, exudate, papilledema. Extraocular motion intact.  Ears: External  ear exam reveals no significant lesions or deformities. Canals clear .TMs normal. Hearing is grossly normal bilaterally. Nose: External nasal exam reveals no deformity or inflammation. Nasal mucosa are pink and moist. No lesions or exudates noted.  Mouth: Oral mucosa and oropharynx reveal no lesions or exudates. Teeth in good repair. Neck: No deformities, masses, or tenderness noted. Range of motion normal. Thyroid : absent L lobe Lungs: Normal respiratory effort; chest expands symmetrically. Lungs are clear to auscultation without rales, wheezes, or increased work of breathing. Heart: Normal rate and rhythm. Normal S1 and S2. No gallop, click, or rub. S4 w/o murmur. Abdomen: Bowel sounds normal; abdomen soft and nontender. No masses, organomegaly or hernias noted.Aorta palpable ; no AAA  Genitalia: Dr Arelia Sneddon Musculoskeletal/extremities: No deformity or scoliosis noted of  the thoracic or lumbar spine. No clubbing, cyanosis, edema, or deformity noted. Range of motion  normal .Tone & strength  normal.Joints  normal. Nail health  good. Vascular: Carotid, radial artery, dorsalis pedis and  posterior tibial pulses are full and equal. No bruits present. Neurologic: Alert and oriented x3. Deep tendon reflexes symmetrical and normal.          Skin: Intact without suspicious lesions or rashes. Lymph: No cervical, axillary lymphadenopathy present. Psych: Mood and affect are normal. Normally interactive                                                                                         Assessment & Plan:  #1 comprehensive physical exam; no acute findings #2 Lipid goals based on NMR Lipoprofile discussed Plan: see Orders

## 2011-10-30 NOTE — Patient Instructions (Addendum)
Preventive Health Care: Please take enteric-coated aspirin 81 mg daily with breakfast.  To prevent palpitations or premature beats, avoid stimulants such as decongestants, diet pills, nicotine, or caffeine (coffee, tea, cola, or chocolate) to excess.  Exercise  30-45  minutes a day, 3-4 days a week. Walking is especially valuable in preventing Osteoporosis. Eat a low-fat diet with lots of fruits and vegetables, up to 7-9 servings per day.  Consume less than 30 grams of sugar per day from foods & drinks with High Fructose Corn Syrup as # 1,2,3 or #4 on label. Health Care Power of Attorney & Living Will place you in charge of your health care  decisions. Verify these are  in place. If you activate My Chart; the results can be released to you as soon as they populate from the lab. If you choose not to use this program; the labs have to be reviewed, copied & mailed   causing a delay in getting the results to you.

## 2011-11-01 ENCOUNTER — Telehealth: Payer: Self-pay

## 2011-11-01 LAB — TSH: TSH: 1.25 u[IU]/mL (ref 0.35–5.50)

## 2011-11-01 NOTE — Telephone Encounter (Signed)
Pt states having problem with mychart. I advised lab results aren't in yet. Pt also states that she can get on mychart and see icons but can't go pass home page. Plz advise       MW

## 2011-11-04 NOTE — Telephone Encounter (Signed)
[place], can you please assist the patient with MyChart questions?  Thank you.

## 2011-11-05 NOTE — Telephone Encounter (Signed)
Pt was not going to correct website. Gave pt correct web address. She will try it and call back if she still has problems.

## 2011-11-06 LAB — VITAMIN D 1,25 DIHYDROXY
Vitamin D 1, 25 (OH)2 Total: 74 pg/mL — ABNORMAL HIGH (ref 18–72)
Vitamin D2 1, 25 (OH)2: <8 pg/mL

## 2012-01-10 ENCOUNTER — Other Ambulatory Visit: Payer: Self-pay | Admitting: Obstetrics and Gynecology

## 2012-01-10 DIAGNOSIS — Z1231 Encounter for screening mammogram for malignant neoplasm of breast: Secondary | ICD-10-CM

## 2012-02-14 ENCOUNTER — Ambulatory Visit: Payer: BC Managed Care – PPO

## 2012-03-12 ENCOUNTER — Ambulatory Visit
Admission: RE | Admit: 2012-03-12 | Discharge: 2012-03-12 | Disposition: A | Discharge status: Home / Self Care | Payer: BC Managed Care – PPO | Source: Ambulatory Visit | Attending: Obstetrics and Gynecology | Admitting: Obstetrics and Gynecology

## 2012-03-12 ENCOUNTER — Ambulatory Visit: Payer: BC Managed Care – PPO

## 2012-03-29 ENCOUNTER — Other Ambulatory Visit: Payer: Self-pay | Admitting: Internal Medicine

## 2012-07-21 ENCOUNTER — Other Ambulatory Visit: Payer: Self-pay | Admitting: Dermatology

## 2012-09-21 LAB — HM PAP SMEAR: HM Pap smear: NORMAL

## 2012-10-29 ENCOUNTER — Telehealth: Payer: Self-pay

## 2012-10-29 NOTE — Telephone Encounter (Signed)
Medication and allergies: reviewed and updated  90 day supply/mail order: na [facility] pharmacy[facility]   Immunizations due:  Flu vaccine due  A/P:   No changes to FH, SH CCS--q 3 years. Checking to find last report; 2011? [facility]-last 01/2012--neg Pap---gyn  To Discuss with Provider: Cholesterol--has not taken Simvastatin since spring-- Has been using Lemon Grass Oil to reduce cholesterol Liposcience report from 2005--last item in the labs tab

## 2012-11-03 ENCOUNTER — Ambulatory Visit (INDEPENDENT_AMBULATORY_CARE_PROVIDER_SITE_OTHER): Payer: BC Managed Care – PPO | Admitting: Internal Medicine

## 2012-11-03 ENCOUNTER — Encounter: Payer: Self-pay | Admitting: Internal Medicine

## 2012-11-03 VITALS — BP 110/78 | HR 73 | Temp 98.6°F | Ht 65.5 in | Wt 135.2 lb

## 2012-11-03 DIAGNOSIS — Z85828 Personal history of other malignant neoplasm of skin: Secondary | ICD-10-CM | POA: Insufficient documentation

## 2012-11-03 DIAGNOSIS — Z23 Encounter for immunization: Secondary | ICD-10-CM

## 2012-11-03 DIAGNOSIS — M899 Disorder of bone, unspecified: Secondary | ICD-10-CM

## 2012-11-03 DIAGNOSIS — Z79899 Other long term (current) drug therapy: Secondary | ICD-10-CM

## 2012-11-03 DIAGNOSIS — M8580 Other specified disorders of bone density and structure, unspecified site: Secondary | ICD-10-CM

## 2012-11-03 DIAGNOSIS — Z0000 Encounter for general adult medical examination without abnormal findings: Secondary | ICD-10-CM

## 2012-11-03 LAB — HEPATIC FUNCTION PANEL
Albumin: 3.4 g/dL — ABNORMAL LOW (ref 3.5–5.2)
Alkaline Phosphatase: 57 U/L (ref 39–117)
Bilirubin, Direct: 0 mg/dL (ref 0.0–0.3)
Total Bilirubin: 0.6 mg/dL (ref 0.3–1.2)

## 2012-11-03 LAB — CBC WITH DIFFERENTIAL/PLATELET
Basophils Absolute: 0 10*3/uL (ref 0.0–0.1)
Eosinophils Absolute: 0.1 10*3/uL (ref 0.0–0.7)
Lymphocytes Relative: 36.7 % (ref 12.0–46.0)
MCHC: 33.2 g/dL (ref 30.0–36.0)
Monocytes Relative: 7.9 % (ref 3.0–12.0)
Neutro Abs: 2 10*3/uL (ref 1.4–7.7)
Neutrophils Relative %: 52.7 % (ref 43.0–77.0)
Platelets: 300 10*3/uL (ref 150.0–400.0)
RDW: 13.8 % (ref 11.5–14.6)

## 2012-11-03 LAB — LIPID PANEL
HDL: 85.2 mg/dL (ref >39.00)
Total CHOL/HDL Ratio: 4
VLDL: 28.8 mg/dL (ref 0.0–40.0)

## 2012-11-03 LAB — BASIC METABOLIC PANEL
CO2: 31 mEq/L (ref 19–32)
Calcium: 9 mg/dL (ref 8.4–10.5)
Creatinine, Ser: 0.7 mg/dL (ref 0.4–1.2)
GFR: 97.45 mL/min (ref >60.00)
Glucose, Bld: 89 mg/dL (ref 70–99)

## 2012-11-03 NOTE — Progress Notes (Signed)
  Subjective:    Patient ID: Terri Pena, female    DOB: September 07, 1955, 57 y.o.   MRN: [id number]  HPI  She  is here for a physical;acute issues denied except for sleep related cervical pain, better with memory form pillow.     Review of Systems A heart healthy diet is followed; exercise encompasses 90 total  minutes  as walking 6 miles / week  without symptoms. Specifically denied are  chest pain, palpitations, dyspnea, or claudication.  Family history is negative  for premature coronary disease. Advanced cholesterol testing reveals  LDL goal is less than 160 ; ideally < 130 . Complementary therapy substituted for  the statin. Significant abdominal symptoms, memory deficit, or myalgias denied on alternative therapy.  She reports no extremity numbness, tingling, weakness. She has no urinary or stool incontinence. She has no fever, chills, or unexplained weight loss. She is on medication for hot flashes from Dr Arelia Sneddon.    Objective:   Physical Exam Gen.: Healthy and well-nourished in appearance. Alert, appropriate and cooperative throughout exam.Appears younger than stated age  Head: Normocephalic without obvious abnormalities  Eyes: No corneal or conjunctival inflammation noted. Pupils equal round reactive to light and accommodation. Fundal exam is benign without hemorrhages, exudate, papilledema. Extraocular motion intact.  Ears: External  ear exam reveals no significant lesions or deformities. Canals clear .TMs normal. Hearing is grossly normal bilaterally. Nose: External nasal exam reveals no deformity or inflammation. Nasal mucosa are pink and moist. No lesions or exudates noted.  Mouth: Oral mucosa and oropharynx reveal no lesions or exudates. Teeth in good repair. Neck: No deformities, masses, or tenderness noted. Range of motion good. Thyroid :R lobe absent. Lungs: Normal respiratory effort; chest expands symmetrically. Lungs are clear to auscultation without rales, wheezes, or increased  work of breathing. Heart: Normal rate and rhythm. Normal S1 and S2. No gallop, click, or rub. No murmur. Abdomen: Bowel sounds normal; abdomen soft and nontender. No masses, organomegaly or hernias noted. Genitalia: As per Gyn                                  Musculoskeletal/extremities: No deformity or scoliosis noted of  the thoracic or lumbar spine.  No clubbing, cyanosis, edema, or significant extremity  deformity noted. Range of motion normal .Tone & strength  Normal. Joints normal . Nail health good. Able to lie down & sit up w/o help. Negative SLR bilaterally Vascular: Carotid, radial artery, dorsalis pedis and  posterior tibial pulses are full and equal. No bruits present. Neurologic: Alert and oriented x3. Deep tendon reflexes symmetrical and normal.       Skin: Intact without suspicious lesions or rashes. Lymph: No cervical, axillary lymphadenopathy present. Psych: Mood and affect are normal. Normally interactive                                                                                        Assessment & Plan:  #1 comprehensive physical exam; no acute findings #2 cervical pain; no neuromuscular deficit  Plan: see Orders  & Recommendations

## 2012-11-03 NOTE — Patient Instructions (Signed)
Your next office appointment will be determined based upon review of your pending labs & BMD. Those instructions will be transmitted to you through My Chart .  If you activate the  My Chart system; lab & Xray results will be released directly  to you as soon as I review & address these through the computer. If you choose not to sign up for My Chart within 24 hours of labs being drawn; results will be reviewed & interpretation added before being copied & mailed, causing a delay in getting the results to you.If you do not receive that report within 7-10 days ,please call. IF YOU ARE NOT GOING TO USE THIS SYSTEM ;PLEASE DO NOT SIGN UP FOR IT AS RESULTS WILL NOT BE MAILED IF MY CHART IS ACTIVE ! Additionally you can use this system to gain direct  access to your records  if  out of town or @ an office of a  physician who is not in  the My Chart network.  This improves continuity of care & places you in control of your medical record.

## 2012-11-05 ENCOUNTER — Encounter: Payer: Self-pay | Admitting: Internal Medicine

## 2012-11-06 ENCOUNTER — Ambulatory Visit (INDEPENDENT_AMBULATORY_CARE_PROVIDER_SITE_OTHER)
Admission: RE | Admit: 2012-11-06 | Discharge: 2012-11-06 | Disposition: A | Discharge status: Home / Self Care | Payer: BC Managed Care – PPO | Source: Ambulatory Visit | Attending: Internal Medicine | Admitting: Internal Medicine

## 2012-11-06 ENCOUNTER — Encounter: Payer: Self-pay | Admitting: *Deleted

## 2012-11-06 DIAGNOSIS — Z0000 Encounter for general adult medical examination without abnormal findings: Secondary | ICD-10-CM

## 2012-11-06 NOTE — Progress Notes (Signed)
Letter mailed to patient.

## 2012-11-09 LAB — VITAMIN D 1,25 DIHYDROXY: Vitamin D 1, 25 (OH)2 Total: 69 pg/mL (ref 18–72)

## 2012-11-12 ENCOUNTER — Encounter: Payer: Self-pay | Admitting: Internal Medicine

## 2012-12-23 ENCOUNTER — Encounter: Payer: Self-pay | Admitting: Internal Medicine

## 2013-01-26 ENCOUNTER — Ambulatory Visit (INDEPENDENT_AMBULATORY_CARE_PROVIDER_SITE_OTHER): Payer: BC Managed Care – PPO | Admitting: Internal Medicine

## 2013-01-26 ENCOUNTER — Encounter: Payer: Self-pay | Admitting: Internal Medicine

## 2013-01-26 VITALS — BP 103/64 | HR 81 | Temp 98.5°F | Wt 136.8 lb

## 2013-01-26 DIAGNOSIS — Z23 Encounter for immunization: Secondary | ICD-10-CM

## 2013-01-26 DIAGNOSIS — R05 Cough: Secondary | ICD-10-CM

## 2013-01-26 DIAGNOSIS — R059 Cough, unspecified: Secondary | ICD-10-CM

## 2013-01-26 DIAGNOSIS — K219 Gastro-esophageal reflux disease without esophagitis: Secondary | ICD-10-CM

## 2013-01-26 MED ORDER — OMEPRAZOLE 20 MG PO CPDR: 20.0000 mg | DELAYED_RELEASE_CAPSULE | Freq: Two times a day (BID) | ORAL | Status: DC

## 2013-01-26 NOTE — Progress Notes (Signed)
Pre visit review using our clinic review tool, if applicable. No additional management support is needed unless otherwise documented below in the visit note. 

## 2013-01-26 NOTE — Patient Instructions (Addendum)
Reflux of gastric acid may be asymptomatic as this may occur mainly during sleep.The triggers for reflux  include stress; the "aspirin family" ; alcohol; peppermint; and caffeine (coffee, tea, cola, and chocolate). The aspirin family would include aspirin and the nonsteroidal agents such as ibuprofen &  Naproxen. Tylenol would not cause reflux. If having symptoms ; food & drink should be avoided for @ least 2 hours before going to bed.  Plain Mucinex (NOT D) for thick secretions ;force NON dairy fluids .   Nasal cleansing in the shower as discussed with lather of mild shampoo.After 10 seconds wash off lather while  exhaling through nostrils. Make sure that all residual soap is removed to prevent irritation.  Nasacort AQ OTC 1 spray in each nostril twice a day as needed. Use the "crossover" technique into opposite nostril spraying toward opposite ear @ 45 degree angle, not straight up into nostril.  Use a Neti pot daily only  as needed for significant sinus congestion; going from open side to congested side . Plain Allegra (NOT D )  160 daily , Loratidine 10 mg , OR Zyrtec 10 mg @ bedtime  as needed for itchy eyes & sneezing.

## 2013-01-26 NOTE — Progress Notes (Signed)
   Subjective:    Patient ID: Terri Pena, female    DOB: 1955/11/17, 58 y.o.   MRN: [id number]  HPI She has had an intermittent, paroxysmal cough lasting 30-45 seconds since 01/06/13. This actually disturbed sleep. It is worse when she is supine.  That has  Resolved temporarily  with NyQuil and cough drops.  She describes dyspepsia at least 3 times per week.  She's had some intermittent headache. She also had some sore throat last week.  She is not on ACE inhibitor.    Review of SystemsI  She's had no significantextrinsic symptoms of itchy, watery eyes, sneezing  She denies frontal headache, facial pain, nasal purulence, otic pain, or discharge  There's been no shortness of breath or wheezing associated with a paroxysmal cough  She also denies fever, chills, or sweats.     Objective:   Physical Exam General appearance:good health ;well nourished; no acute distress or increased work of breathing is present.  No  lymphadenopathy about the head, neck, or axilla noted.   Eyes: No conjunctival inflammation or lid edema is present. .  Ears:  External ear exam shows no significant lesions or deformities.  Otoscopic examination reveals clear canals, tympanic membranes are intact bilaterally without bulging, retraction, inflammation or discharge.  Nose:  External nasal examination shows no deformity or inflammation. Nasal mucosa are pink and moist without lesions or exudates. No septal dislocation or deviation.No obstruction to airflow.   Oral exam: Dental hygiene is good; lips and gums are healthy appearing.There is no oropharyngeal erythema or exudate noted.   Neck:  No deformities,  masses, or tenderness noted.    Heart:  Normal rate and regular rhythm. S1 and S2 normal without gallop, murmur, click, rub or other extra sounds.   Lungs:Chest clear to auscultation; no wheezes, rhonchi,rales ,or rubs present.No increased work of breathing.  Dry cough  Extremities:  No cyanosis,  edema, or clubbing  noted    Skin: Warm & dry .         Assessment & Plan:  #1 cough due to laryngopharyngeal reflux  See orders

## 2013-02-02 ENCOUNTER — Other Ambulatory Visit: Payer: Self-pay

## 2013-02-02 DIAGNOSIS — Z1231 Encounter for screening mammogram for malignant neoplasm of breast: Secondary | ICD-10-CM

## 2013-03-15 ENCOUNTER — Ambulatory Visit
Admission: RE | Admit: 2013-03-15 | Discharge: 2013-03-15 | Disposition: A | Discharge status: Home / Self Care | Payer: BC Managed Care – PPO | Source: Ambulatory Visit

## 2013-03-15 DIAGNOSIS — Z1231 Encounter for screening mammogram for malignant neoplasm of breast: Secondary | ICD-10-CM

## 2013-07-27 ENCOUNTER — Other Ambulatory Visit: Payer: Self-pay

## 2013-07-27 MED ORDER — PRAVASTATIN SODIUM 40 MG PO TABS: ORAL_TABLET | ORAL | Status: DC

## 2013-11-05 ENCOUNTER — Ambulatory Visit (INDEPENDENT_AMBULATORY_CARE_PROVIDER_SITE_OTHER): Payer: BC Managed Care – PPO | Admitting: Internal Medicine

## 2013-11-05 ENCOUNTER — Other Ambulatory Visit (INDEPENDENT_AMBULATORY_CARE_PROVIDER_SITE_OTHER): Payer: BC Managed Care – PPO

## 2013-11-05 ENCOUNTER — Other Ambulatory Visit: Payer: Self-pay | Admitting: Internal Medicine

## 2013-11-05 ENCOUNTER — Encounter: Payer: Self-pay | Admitting: Internal Medicine

## 2013-11-05 ENCOUNTER — Encounter: Payer: BC Managed Care – PPO | Admitting: Internal Medicine

## 2013-11-05 VITALS — BP 106/72 | HR 66 | Temp 98.2°F | Resp 12 | Ht 65.5 in | Wt 136.5 lb

## 2013-11-05 DIAGNOSIS — E785 Hyperlipidemia, unspecified: Secondary | ICD-10-CM

## 2013-11-05 DIAGNOSIS — Z0189 Encounter for other specified special examinations: Secondary | ICD-10-CM

## 2013-11-05 DIAGNOSIS — Z0000 Encounter for general adult medical examination without abnormal findings: Secondary | ICD-10-CM

## 2013-11-05 DIAGNOSIS — Z8639 Personal history of other endocrine, nutritional and metabolic disease: Secondary | ICD-10-CM

## 2013-11-05 LAB — BASIC METABOLIC PANEL
BUN: 9 mg/dL (ref 6–23)
CALCIUM: 8.9 mg/dL (ref 8.4–10.5)
CO2: 27 mEq/L (ref 19–32)
Chloride: 105 mEq/L (ref 96–112)
Creatinine, Ser: 0.8 mg/dL (ref 0.4–1.2)
GFR: 82.53 mL/min (ref >60.00)
Glucose, Bld: 82 mg/dL (ref 70–99)
POTASSIUM: 3.8 meq/L (ref 3.5–5.1)
SODIUM: 140 meq/L (ref 135–145)

## 2013-11-05 LAB — CBC WITH DIFFERENTIAL/PLATELET
BASOS ABS: 0 10*3/uL (ref 0.0–0.1)
Basophils Relative: 0.6 % (ref 0.0–3.0)
EOS PCT: 1.4 % (ref 0.0–5.0)
Eosinophils Absolute: 0.1 10*3/uL (ref 0.0–0.7)
HEMATOCRIT: 43.9 % (ref 36.0–46.0)
Hemoglobin: 14.3 g/dL (ref 12.0–15.0)
LYMPHS ABS: 1.7 10*3/uL (ref 0.7–4.0)
LYMPHS PCT: 40.8 % (ref 12.0–46.0)
MCHC: 32.5 g/dL (ref 30.0–36.0)
MCV: 89.9 fl (ref 78.0–100.0)
MONOS PCT: 8 % (ref 3.0–12.0)
Monocytes Absolute: 0.3 10*3/uL (ref 0.1–1.0)
Neutro Abs: 2 10*3/uL (ref 1.4–7.7)
Neutrophils Relative %: 49.2 % (ref 43.0–77.0)
PLATELETS: 332 10*3/uL (ref 150.0–400.0)
RBC: 4.88 Mil/uL (ref 3.87–5.11)
RDW: 13.6 % (ref 11.5–15.5)
WBC: 4.1 10*3/uL (ref 4.0–10.5)

## 2013-11-05 LAB — HEPATIC FUNCTION PANEL
ALK PHOS: 68 U/L (ref 39–117)
ALT: 12 U/L (ref 0–35)
AST: 19 U/L (ref 0–37)
Albumin: 3 g/dL — ABNORMAL LOW (ref 3.5–5.2)
BILIRUBIN DIRECT: 0.1 mg/dL (ref 0.0–0.3)
BILIRUBIN TOTAL: 0.8 mg/dL (ref 0.2–1.2)
Total Protein: 6.4 g/dL (ref 6.0–8.3)

## 2013-11-05 LAB — TSH: TSH: 1.82 u[IU]/mL (ref 0.35–4.50)

## 2013-11-05 LAB — VITAMIN D 25 HYDROXY (VIT D DEFICIENCY, FRACTURES): VITD: 41.39 ng/mL (ref 30.00–100.00)

## 2013-11-05 NOTE — Progress Notes (Signed)
   Subjective:    Patient ID: Terri Pena, female    DOB: 07/05/1955, 58 y.o.   MRN: [id number]  HPI  She  is here for a physical;acute issues include some cervical spine arthralgias  She is now seeing a chiropractor; 28 treatments are anticipated.  There is no family history of premature heart attack or stroke  Prior advanced cholesterol testing revealed that her LDL goal is less than 160.  Review of Systems    She has no numbness, tingling, or weakness in her upper or lower extremities  She also denies loss control of bladder or bowels.  Chest pain, palpitations, tachycardia, exertional dyspnea, paroxysmal nocturnal dyspnea, claudication or edema are absent.       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 The left is not enlarged. I can appreciate no nodules. There is a very subtle asymmetric curvature to the mid upper thoracic spine suggesting subclinical scoliosis She has scattered nevi over the back. None appear pathologic She has moderate crepitus of the knees without effusion.  Gen.: Healthy and well-nourished in appearance. Alert, appropriate and cooperative throughout exam. Appears younger than stated age  Head: Normocephalic without obvious abnormalities  Eyes: No corneal or conjunctival inflammation noted. Pupils equal round reactive to light and accommodation. Extraocular motion intact.  Ears: External  ear exam reveals no significant lesions or deformities. Canals clear .TMs normal. Hearing is grossly normal bilaterally. Nose: External nasal exam reveals no deformity or inflammation. Nasal mucosa are pink and moist. No lesions or exudates noted.   Mouth: Oral mucosa and oropharynx reveal no lesions or exudates. Teeth in good repair. Neck: No deformities, masses, or tenderness noted. Range of motion normal. Lungs: Normal respiratory effort; chest expands symmetrically. Lungs are clear to auscultation without rales, wheezes, or increased  work of breathing. Heart: Normal rate and rhythm. Normal S1 and S2. No gallop, click, or rub. S4 w/o murmur. Abdomen: Bowel sounds normal; abdomen soft and nontender. No masses, organomegaly or hernias noted. Genitalia:  as per Gyn                                  Musculoskeletal/extremities: No clubbing, cyanosis, edema, or significant extremity  deformity noted. Range of motion normal .Tone & strength normal. Hand joints normal  Fingernail  health good. Able to lie down & sit up w/o help. Negative SLR bilaterally Vascular: Carotid, radial artery, dorsalis pedis and  posterior tibial pulses are full and equal. No bruits present. Neurologic: Alert and oriented x3. Deep tendon reflexes symmetrical and normal.  Gait normal.      Skin: Intact without suspicious lesions or rashes. Lymph: No cervical, axillary lymphadenopathy present. Psych: Mood and affect are normal. Normally interactive                                                                                        Assessment & Plan:  #1 comprehensive physical exam; no acute findings  Plan: see Orders  & Recommendations

## 2013-11-05 NOTE — Patient Instructions (Addendum)
Your next office appointment will be determined based upon review of your pending labs & Ultrasound . Those instructions will be transmitted to you through My Chart    To prevent palpitations or premature beats, avoid stimulants such as decongestants, diet pills, nicotine, or caffeine (coffee, tea, cola, or chocolate) to excess.

## 2013-11-05 NOTE — Progress Notes (Signed)
Pre visit review using our clinic review tool, if applicable. No additional management support is needed unless otherwise documented below in the visit note. 

## 2013-11-08 ENCOUNTER — Encounter: Payer: BC Managed Care – PPO | Admitting: Internal Medicine

## 2013-11-08 LAB — NMR LIPOPROFILE WITH LIPIDS
Cholesterol, Total: 296 mg/dL — ABNORMAL HIGH (ref 100–199)
HDL Particle Number: 42.6 umol/L (ref >=30.5)
HDL Size: 9.6 nm (ref >=9.2)
HDL-C: 90 mg/dL (ref >39)
LDL (calc): 189 mg/dL — ABNORMAL HIGH (ref 0–99)
LDL Particle Number: 2090 nmol/L — ABNORMAL HIGH (ref <1000)
LDL Size: 21.8 nm (ref >=20.8)
LP-IR Score: <25 (ref <=45)
Large HDL-P: 11.9 umol/L (ref >=4.8)
Large VLDL-P: <0.8 nmol/L (ref <=2.7)
Small LDL Particle Number: 458 nmol/L (ref <=527)
Triglycerides: 83 mg/dL (ref 0–149)
VLDL Size: 33 nm (ref <=46.6)

## 2013-11-09 ENCOUNTER — Other Ambulatory Visit: Payer: Self-pay | Admitting: Obstetrics and Gynecology

## 2013-11-09 ENCOUNTER — Ambulatory Visit
Admission: RE | Admit: 2013-11-09 | Discharge: 2013-11-09 | Disposition: A | Discharge status: Home / Self Care | Payer: BC Managed Care – PPO | Source: Ambulatory Visit | Attending: Internal Medicine | Admitting: Internal Medicine

## 2013-11-09 DIAGNOSIS — Z8639 Personal history of other endocrine, nutritional and metabolic disease: Secondary | ICD-10-CM

## 2013-11-10 ENCOUNTER — Other Ambulatory Visit: Payer: Self-pay | Admitting: Internal Medicine

## 2013-11-10 DIAGNOSIS — Z8639 Personal history of other endocrine, nutritional and metabolic disease: Secondary | ICD-10-CM

## 2013-11-11 LAB — CYTOLOGY - PAP

## 2013-11-22 ENCOUNTER — Encounter: Payer: Self-pay | Admitting: Internal Medicine

## 2013-11-23 ENCOUNTER — Ambulatory Visit
Admission: RE | Admit: 2013-11-23 | Discharge: 2013-11-23 | Disposition: A | Discharge status: Home / Self Care | Payer: BC Managed Care – PPO | Source: Ambulatory Visit | Attending: Internal Medicine | Admitting: Internal Medicine

## 2013-11-23 ENCOUNTER — Other Ambulatory Visit (HOSPITAL_COMMUNITY)
Admission: RE | Admit: 2013-11-23 | Discharge: 2013-11-23 | Disposition: A | Discharge status: Home / Self Care | Payer: BC Managed Care – PPO | Source: Ambulatory Visit | Attending: Diagnostic Radiology | Admitting: Diagnostic Radiology

## 2013-11-23 DIAGNOSIS — Z8639 Personal history of other endocrine, nutritional and metabolic disease: Secondary | ICD-10-CM

## 2013-11-23 DIAGNOSIS — E041 Nontoxic single thyroid nodule: Secondary | ICD-10-CM | POA: Diagnosis present

## 2014-02-17 ENCOUNTER — Telehealth: Payer: Self-pay

## 2014-02-17 ENCOUNTER — Other Ambulatory Visit: Payer: Self-pay | Admitting: Internal Medicine

## 2014-02-17 DIAGNOSIS — E785 Hyperlipidemia, unspecified: Secondary | ICD-10-CM

## 2014-02-17 MED ORDER — PRAVASTATIN SODIUM 40 MG PO TABS: 40.0000 mg | ORAL_TABLET | Freq: Every day | ORAL | Status: DC

## 2014-02-17 NOTE — Telephone Encounter (Signed)
Sent to [facility] Fasting labs 10 weeks after started; orders in

## 2014-02-17 NOTE — Telephone Encounter (Signed)
Received a refill request for Pravastatin #90.  I do not see this on patient's medication list.

## 2014-02-18 NOTE — Telephone Encounter (Signed)
Patient aware Pravastatin has been sent to [facility] and that she is to have labs done after 10 weeks

## 2014-02-23 ENCOUNTER — Other Ambulatory Visit: Payer: Self-pay

## 2014-02-23 DIAGNOSIS — Z1231 Encounter for screening mammogram for malignant neoplasm of breast: Secondary | ICD-10-CM

## 2014-03-17 ENCOUNTER — Ambulatory Visit: Payer: Self-pay

## 2014-03-22 ENCOUNTER — Ambulatory Visit
Admission: RE | Admit: 2014-03-22 | Discharge: 2014-03-22 | Disposition: A | Discharge status: Home / Self Care | Payer: BLUE CROSS/BLUE SHIELD | Source: Ambulatory Visit

## 2014-03-22 DIAGNOSIS — Z1231 Encounter for screening mammogram for malignant neoplasm of breast: Secondary | ICD-10-CM

## 2014-05-05 ENCOUNTER — Ambulatory Visit (INDEPENDENT_AMBULATORY_CARE_PROVIDER_SITE_OTHER)
Admission: RE | Admit: 2014-05-05 | Discharge: 2014-05-05 | Disposition: A | Discharge status: Home / Self Care | Payer: BLUE CROSS/BLUE SHIELD | Source: Ambulatory Visit | Attending: Internal Medicine | Admitting: Internal Medicine

## 2014-05-05 ENCOUNTER — Other Ambulatory Visit (INDEPENDENT_AMBULATORY_CARE_PROVIDER_SITE_OTHER): Payer: BLUE CROSS/BLUE SHIELD

## 2014-05-05 ENCOUNTER — Ambulatory Visit (INDEPENDENT_AMBULATORY_CARE_PROVIDER_SITE_OTHER): Payer: BLUE CROSS/BLUE SHIELD | Admitting: Internal Medicine

## 2014-05-05 ENCOUNTER — Encounter: Payer: Self-pay | Admitting: Internal Medicine

## 2014-05-05 VITALS — BP 108/78 | HR 66 | Temp 98.3°F | Ht 66.0 in | Wt 138.0 lb

## 2014-05-05 DIAGNOSIS — G8929 Other chronic pain: Secondary | ICD-10-CM

## 2014-05-05 DIAGNOSIS — E785 Hyperlipidemia, unspecified: Secondary | ICD-10-CM

## 2014-05-05 DIAGNOSIS — M542 Cervicalgia: Secondary | ICD-10-CM | POA: Diagnosis not present

## 2014-05-05 LAB — HEPATIC FUNCTION PANEL
ALK PHOS: 72 U/L (ref 39–117)
ALT: 13 U/L (ref 0–35)
AST: 17 U/L (ref 0–37)
Albumin: 3.6 g/dL (ref 3.5–5.2)
BILIRUBIN TOTAL: 0.4 mg/dL (ref 0.2–1.2)
Bilirubin, Direct: 0.1 mg/dL (ref 0.0–0.3)
TOTAL PROTEIN: 6.1 g/dL (ref 6.0–8.3)

## 2014-05-05 LAB — LIPID PANEL
CHOL/HDL RATIO: 3
Cholesterol: 224 mg/dL — ABNORMAL HIGH (ref 0–200)
HDL: 78.6 mg/dL (ref >39.00)
LDL Cholesterol: 130 mg/dL — ABNORMAL HIGH (ref 0–99)
NonHDL: 145.4
TRIGLYCERIDES: 78 mg/dL (ref 0.0–149.0)
VLDL: 15.6 mg/dL (ref 0.0–40.0)

## 2014-05-05 LAB — CK: Total CK: 73 U/L (ref 7–177)

## 2014-05-05 NOTE — Progress Notes (Signed)
Pre visit review using our clinic review tool, if applicable. No additional management support is needed unless otherwise documented below in the visit note. 

## 2014-05-05 NOTE — Progress Notes (Signed)
   Subjective:    Patient ID: Terri Pena, female    DOB: March 02, 1955, 59 y.o.   MRN: [id number]  HPI She describes recurrent neck pain since September 2014. There was no specific trigger. She denies any history of injury or surgery. She is receiving manipulation @  [facility]; this involves popping the neck and back. She continues to have symptoms mainly when she lifts her head off the pillow or if she turns her head laterally very quickly. She notices some "popping" in her neck  She denies any associated neuromuscular symptoms.  PMH includes Osteopenia with T score -1.4 @ hip. FRAX risk low.  Statin therapy follow up due. She has been compliant with the medication without adverse effect. She is on a heart healthy diet; she's not exercising due to having moved twice in the last several months. Maternal grandmother had heart attack at 93. There is no premature heart attack or stroke.  Review of Systems Fever, chills, sweats, or unexplained weight loss not present. No significant headaches. Mental status change or memory loss denied. Blurred vision , diplopia or vision loss absent. Vertigo, near syncope or imbalance denied. There is no numbness, tingling, or weakness in extremities.   No loss of control of bladder or bowels. Radicular type pain absent. No seizure stigmata.     Objective:   Physical Exam  General appearance :adequately nourished; in no distress. Eyes: No conjunctival inflammation or scleral icterus is present. FOV & EOMI Oral exam:  Lips and gums are healthy appearing.There is no oropharyngeal erythema or exudate noted. Dental hygiene is good. Neck: full ROM;no crepitus clinically Heart:  Normal rate and regular rhythm. S1 and S2 normal without gallop, murmur, click, rub or other extra sounds   Lungs:Chest clear to auscultation; no wheezes, rhonchi,rales ,or rubs present.No increased work of breathing.  Abdomen: bowel sounds normal, soft and non-tender  without masses, organomegaly or hernias noted.  No guarding or rebound. Aorta palpable ; no AAA Vascular : all pulses equal ; no bruits present. Spine: no apparent scoliosis or malalignment Skin:Warm & dry.  Intact without suspicious lesions or rashes ; no tenting or jaundice  Lymphatic: No lymphadenopathy is noted about the head, neck, axilla Neurologic exam : Cn 2-7 intact Strength equal & normal in upper & lower extremities Able to walk on heels and toes.   Balance normal  Romberg normal, finger to nose normal        Assessment & Plan:  #1 neck pain, chronic #2 dyslipidemia See orders

## 2014-05-05 NOTE — Patient Instructions (Addendum)
Use a cervical memory foam pillow to prevent hyperextension or hyperflexion of the cervical spine while sleeping.  Use an anti-inflammatory cream such as Aspercreme or Zostrix cream twice a day to the affected area as needed. In lieu of this warm moist compresses or  hot water bottle can be used. Do not apply ice .   Your next office appointment will be determined based upon review of your pending xrays  Those instructions will be transmitted to you by My Chart   Critical results will be called.   Followup as needed for any active or acute issue. Please report any significant change in your symptoms.

## 2014-06-27 ENCOUNTER — Encounter: Payer: Self-pay | Admitting: Internal Medicine

## 2014-07-05 ENCOUNTER — Other Ambulatory Visit: Payer: Self-pay | Admitting: Internal Medicine

## 2014-07-06 ENCOUNTER — Other Ambulatory Visit: Payer: Self-pay

## 2014-07-06 DIAGNOSIS — E785 Hyperlipidemia, unspecified: Secondary | ICD-10-CM

## 2014-07-06 MED ORDER — PRAVASTATIN SODIUM 40 MG PO TABS: 40.0000 mg | ORAL_TABLET | Freq: Every day | ORAL | Status: DC

## 2014-07-06 NOTE — Telephone Encounter (Signed)
prevastatin rx sent to pharm

## 2014-08-01 ENCOUNTER — Telehealth: Payer: Self-pay

## 2014-08-01 NOTE — Telephone Encounter (Signed)
I am sorry about her loss Hydroxyzine 10 mg q 6-8 hrs prn #30

## 2014-08-01 NOTE — Telephone Encounter (Signed)
  Call Id: [id number] [place] [facility] - Client [place] Patient Name: Terri Pena Gender: Female DOB: 1955-07-14 Age: 59 Y 40 M 27 D Return Phone Number: [phone] (Primary), [phone] (Secondary) Address: City/State/Zip: [address] Client [facility] - Client Client Site [place] - Night Physician [place], [place] Type Call Call Type Triage / Clinical Relationship To Patient Self Return Phone Number [phone] (Primary) Chief Complaint Prescription Refill or Medication Request (non symptomatic) Initial Comment Caller states mother passed away 04-16-2022 morning, states she's itching on her hands, bottom of foot and right above her bottom area. Thinks it's her nerves, asking if she can get something called in PreDisposition Go to Urgent Care/Walk-In Clinic Nurse Assessment Nurse: Margaretmary Dys, RN, Hettie Date/Time (Eastern Time): 07/30/2014 6:20:58 PM Confirm and document reason for call. If symptomatic, describe symptoms. ---Caller states mother passed away 04-16-2022 morning, states she's itching on her hands, behind ear, top of foot and right above her bottom area. Thinks it's her nerves, asking if she can get something called in. Has taken benadryl and helps a little. Has the patient traveled out of the country within the last 30 days? ---No Does the patient require triage? ---Yes Related visit to physician within the last 2 weeks? ---No Does the PT have any chronic conditions? (i.e. diabetes, asthma, etc.) ---No Guidelines Guideline Title Affirmed Question Affirmed Notes Nurse Date/Time (Eastern Time) Rash or Redness - Widespread Mild widespread rash Brewner, RN, Hettie 07/30/2014 6:23:53 PM Disp. Time Eilene Ghazi Time) Disposition Final User 07/30/2014 5:15:07 PM Attempt made - no message left Margaretmary Dys, RN, Hettie 07/30/2014 5:47:29 PM Attempt made - message left Brewner, RN,  Hettie 07/30/2014 6:27:59 PM See PCP When Office is Open (within 3 days) Yes Brewner, RN, Hettie Caller Understands: Yes PLEASE NOTE: All timestamps contained within this report are represented as [place] Standard Time. CONFIDENTIALTY NOTICE: This fax transmission is intended only for the addressee. It contains information that is legally privileged, confidential or otherwise protected from use or disclosure. If you are not the intended recipient, you are strictly prohibited from reviewing, disclosing, copying using or disseminating any of this information or taking any action in reliance on or regarding this information. If you have received this fax in error, please notify us immediately by telephone so that we can arrange for its return to [place]. Phone: [phone], Toll-Free: [phone], Fax: [phone] Page: 2 of 2 Call Id: [id number] Disagree/Comply: Comply Care Advice Given Per Guideline SEE PCP WITHIN 3 DAYS: You need to be examined within 2 or 3 days. Call your doctor during regular office hours and make an appointment. (Note: if office will be open tomorrow, tell caller to call then, not in 3 days). BENADRYL FOR ITCHING: Take Benadryl (OTC diphenhydramine) 4 times per day until seen. Adult dose is 25-50 mg PO. HYDROCORTISONE CREAM: * For very itchy spots, apply hydrocortisone cream 4 times a day for 5 days. OATMEAL AVEENO BATH FOR ITCHING - Sprinkle contents of one Aveeno packet under running faucet with comfortably warm water. Bathe for 15 - 20 minutes, 1-2 times daily. Pat dry using towel - do not rub. CALL BACK IF: * Rash becomes purple or blood-colored or blister-like * Fever occurs or severe itching * You become worse. CARE ADVICE given per Rash - Widespread and Cause Unknown (Adult) guideline. After Care Instructions Given Call Event Type User Date / Time Description

## 2014-08-02 ENCOUNTER — Other Ambulatory Visit: Payer: Self-pay

## 2014-08-02 MED ORDER — HYDROXYZINE HCL 10 MG PO TABS: 10.0000 mg | ORAL_TABLET | Freq: Four times a day (QID) | ORAL | Status: DC | PRN

## 2014-08-02 NOTE — Telephone Encounter (Signed)
Pt informed

## 2014-11-16 ENCOUNTER — Other Ambulatory Visit: Payer: Self-pay | Admitting: Internal Medicine

## 2014-11-16 DIAGNOSIS — Z0000 Encounter for general adult medical examination without abnormal findings: Secondary | ICD-10-CM

## 2014-11-23 ENCOUNTER — Other Ambulatory Visit (INDEPENDENT_AMBULATORY_CARE_PROVIDER_SITE_OTHER): Payer: Self-pay

## 2014-11-23 DIAGNOSIS — Z0000 Encounter for general adult medical examination without abnormal findings: Secondary | ICD-10-CM

## 2014-11-23 DIAGNOSIS — Z0189 Encounter for other specified special examinations: Secondary | ICD-10-CM

## 2014-11-23 LAB — CBC WITH DIFFERENTIAL/PLATELET
Basophils Absolute: 0 10*3/uL (ref 0.0–0.1)
Basophils Relative: 0.5 % (ref 0.0–3.0)
Eosinophils Absolute: 0.1 10*3/uL (ref 0.0–0.7)
Eosinophils Relative: 1.6 % (ref 0.0–5.0)
HEMATOCRIT: 42.3 % (ref 36.0–46.0)
Hemoglobin: 14.1 g/dL (ref 12.0–15.0)
LYMPHS PCT: 26.4 % (ref 12.0–46.0)
Lymphs Abs: 1.5 10*3/uL (ref 0.7–4.0)
MCHC: 33.2 g/dL (ref 30.0–36.0)
MCV: 88.3 fl (ref 78.0–100.0)
Monocytes Absolute: 0.4 10*3/uL (ref 0.1–1.0)
Monocytes Relative: 7.6 % (ref 3.0–12.0)
NEUTROS ABS: 3.6 10*3/uL (ref 1.4–7.7)
Neutrophils Relative %: 63.9 % (ref 43.0–77.0)
PLATELETS: 325 10*3/uL (ref 150.0–400.0)
RBC: 4.8 Mil/uL (ref 3.87–5.11)
RDW: 13.4 % (ref 11.5–15.5)
WBC: 5.7 10*3/uL (ref 4.0–10.5)

## 2014-11-23 LAB — BASIC METABOLIC PANEL
BUN: 9 mg/dL (ref 6–23)
CO2: 29 meq/L (ref 19–32)
CREATININE: 0.74 mg/dL (ref 0.40–1.20)
Calcium: 9.2 mg/dL (ref 8.4–10.5)
Chloride: 106 mEq/L (ref 96–112)
GFR: 84.81 mL/min (ref >60.00)
GLUCOSE: 93 mg/dL (ref 70–99)
Potassium: 4.2 mEq/L (ref 3.5–5.1)
SODIUM: 142 meq/L (ref 135–145)

## 2014-11-23 LAB — LIPID PANEL
CHOLESTEROL: 273 mg/dL — AB (ref 0–200)
HDL: 81.2 mg/dL (ref >39.00)
LDL Cholesterol: 176 mg/dL — ABNORMAL HIGH (ref 0–99)
NonHDL: 191.46
Total CHOL/HDL Ratio: 3
Triglycerides: 76 mg/dL (ref 0.0–149.0)
VLDL: 15.2 mg/dL (ref 0.0–40.0)

## 2014-11-23 LAB — HEPATIC FUNCTION PANEL
ALBUMIN: 3.5 g/dL (ref 3.5–5.2)
ALT: 9 U/L (ref 0–35)
AST: 14 U/L (ref 0–37)
Alkaline Phosphatase: 57 U/L (ref 39–117)
Bilirubin, Direct: 0.1 mg/dL (ref 0.0–0.3)
TOTAL PROTEIN: 6.1 g/dL (ref 6.0–8.3)
Total Bilirubin: 0.5 mg/dL (ref 0.2–1.2)

## 2014-11-23 LAB — TSH: TSH: 1.2 u[IU]/mL (ref 0.35–4.50)

## 2014-11-25 ENCOUNTER — Encounter: Payer: Self-pay | Admitting: Internal Medicine

## 2014-11-25 ENCOUNTER — Ambulatory Visit (INDEPENDENT_AMBULATORY_CARE_PROVIDER_SITE_OTHER): Payer: Self-pay | Admitting: Internal Medicine

## 2014-11-25 VITALS — BP 110/78 | HR 76 | Temp 98.6°F | Resp 16 | Ht 66.0 in | Wt 137.0 lb

## 2014-11-25 DIAGNOSIS — Z8639 Personal history of other endocrine, nutritional and metabolic disease: Secondary | ICD-10-CM

## 2014-11-25 DIAGNOSIS — Z0000 Encounter for general adult medical examination without abnormal findings: Secondary | ICD-10-CM

## 2014-11-25 DIAGNOSIS — Z0189 Encounter for other specified special examinations: Secondary | ICD-10-CM

## 2014-11-25 DIAGNOSIS — E785 Hyperlipidemia, unspecified: Secondary | ICD-10-CM

## 2014-11-25 MED ORDER — PRAVASTATIN SODIUM 40 MG PO TABS: 40.0000 mg | ORAL_TABLET | Freq: Every day | ORAL | Status: DC

## 2014-11-25 MED ORDER — PAROXETINE HCL 10 MG PO TABS: 10.0000 mg | ORAL_TABLET | ORAL | Status: DC

## 2014-11-25 NOTE — Progress Notes (Signed)
   Subjective:    Patient ID: Terri Pena, female    DOB: Jul 27, 1955, 59 y.o.   MRN: [id number]  HPI The patient is here for a physical to assess status of active health conditions.  PMH, FH, & Social History reviewed & updated.No change in [place] as recorded.  She has not been compliant with her statin; she states she takes the pravastatin on average 8 times a month. She has been walking 5 miles a week. She denies associated cardio pulmonary symptoms. She restricts salt and red meat. She does eat some fried foods.  Colonoscopy was performed 2016 by Dr. Collene Mares. 2 polyps were found. She'll follow-up in 5 years. She has no active GI symptoms at this time.  Review of systems is positive for nocturia twice nightly.  Review of Systems  Chest pain, palpitations, tachycardia, exertional dyspnea, paroxysmal nocturnal dyspnea, claudication or edema are absent. No unexplained weight loss, abdominal pain, significant dyspepsia, dysphagia, melena, rectal bleeding, or persistently small caliber stools. Dysuria, pyuria, hematuria, frequency, or polyuria are denied. Change in hair, skin, nails denied. No bowel changes of constipation or diarrhea. No intolerance to heat or cold.    Objective:   Physical Exam  Pertinent or positive findings include: There is an aortic bruit; she has no evidence of an aneurysm clinically.  General appearance :adequately nourished; in no distress.  Eyes: No conjunctival inflammation or scleral icterus is present.  Oral exam:  Lips and gums are healthy appearing.There is no oropharyngeal erythema or exudate noted. Dental hygiene is good.  Heart:  Normal rate and regular rhythm. S1 and S2 normal without gallop, murmur, click, rub or other extra sounds    Lungs:Chest clear to auscultation; no wheezes, rhonchi,rales ,or rubs present.No increased work of breathing.   Abdomen: bowel sounds normal, soft and non-tender without masses, organomegaly or hernias noted.  No guarding  or rebound.   Vascular : all pulses equal ; no bruits present.  Skin:Warm & dry.  Intact without suspicious lesions or rashes ; no tenting or jaundice   Lymphatic: No lymphadenopathy is noted about the head, neck, axilla.   Neuro: Strength, tone & DTRs normal.     Assessment & Plan:  #1 comprehensive physical exam; no acute findings  Plan: see Orders  & Recommendations

## 2014-11-25 NOTE — Progress Notes (Signed)
Pre visit review using our clinic review tool, if applicable. No additional management support is needed unless otherwise documented below in the visit note. 

## 2014-11-25 NOTE — Patient Instructions (Addendum)
Please follow a Mediaterranean type diet  (many good cook books readily available) or review Dr Nunzio Cory book Eat, [place] for best  dietary cholesterol information & options.   Cardiovascular exercise, this can be as simple a program as walking, is recommended 30-45 minutes 3-4 times per week.   Please take enteric-coated aspirin 81 mg daily with breakfast.

## 2014-11-26 ENCOUNTER — Encounter: Payer: Self-pay | Admitting: Internal Medicine

## 2015-04-24 ENCOUNTER — Other Ambulatory Visit: Payer: Self-pay

## 2015-04-24 DIAGNOSIS — Z1231 Encounter for screening mammogram for malignant neoplasm of breast: Secondary | ICD-10-CM

## 2015-05-10 ENCOUNTER — Ambulatory Visit: Payer: Self-pay

## 2015-05-11 ENCOUNTER — Ambulatory Visit
Admission: RE | Admit: 2015-05-11 | Discharge: 2015-05-11 | Disposition: A | Discharge status: Home / Self Care | Payer: No Typology Code available for payment source | Source: Ambulatory Visit

## 2015-05-11 DIAGNOSIS — Z1231 Encounter for screening mammogram for malignant neoplasm of breast: Secondary | ICD-10-CM

## 2015-12-02 ENCOUNTER — Encounter (HOSPITAL_COMMUNITY): Payer: Self-pay

## 2015-12-02 ENCOUNTER — Emergency Department (HOSPITAL_COMMUNITY)
Admission: EM | Admit: 2015-12-02 | Discharge: 2015-12-03 | Disposition: A | Discharge status: Home / Self Care | Payer: Self-pay | Attending: Emergency Medicine | Admitting: Emergency Medicine

## 2015-12-02 DIAGNOSIS — Z85828 Personal history of other malignant neoplasm of skin: Secondary | ICD-10-CM | POA: Insufficient documentation

## 2015-12-02 DIAGNOSIS — G4485 Primary stabbing headache: Secondary | ICD-10-CM | POA: Insufficient documentation

## 2015-12-02 DIAGNOSIS — Z79899 Other long term (current) drug therapy: Secondary | ICD-10-CM | POA: Insufficient documentation

## 2015-12-02 NOTE — ED Triage Notes (Signed)
Pt states that she is having sharp intermittent L sided head pain. She states that she has had these pain 3 other times this year. She states that they come and go about every 15 seconds and ibuprofen helped yesterday, but did not help today. A&Ox4. Endorses blurry vision accompanying the pain. Denies other neuro changes. Ambulatory with a steady gait.

## 2015-12-02 NOTE — ED Provider Notes (Addendum)
[facility] DEPT Provider Note   CSN: [id number] Arrival date & time: 12/02/15  1907   By signing my name below, I, Delton Prairie, attest that this documentation has been prepared under the direction and in the presence of  Junius Creamer, NP. Electronically Signed: Delton Prairie, ED Scribe. 12/02/15. 11:47 PM.   History   Chief Complaint Chief Complaint  Patient presents with  . Headache    L side   The history is provided by the patient. No language interpreter was used.   HPI Comments:  Terri Pena is a 60 y.o. female who presents to the Emergency Department complaining of recurring, intermittent episodes of left sided headaches with associated stabbing pain x 3-4 days. She notes her episodes tonight have been occurring ~ every 10 seconds. Pt states she has had similar symptoms in the past (about 3 times in the past year) with episodes lasting for ~7 days. She has taken ibuprofen with little relief. Pt denies congestion, any other associated symptoms and modifying factors at this time.   Past Medical History:  Diagnosis Date  . Hyperlipidemia    LDL goal = < 160  . Thyroid nodule [id number]   benign follicular nodule , Dr Margot Chimes    Patient Active Problem List   Diagnosis Date Noted  . Hx of skin cancer, basal cell 11/03/2012  . Osteopenia 10/30/2011  . POSTMENOPAUSAL SYNDROME 10/04/2009  . History of thyroid nodule 08/24/2007  . Hyperlipidemia 08/05/2006    Past Surgical History:  Procedure Laterality Date  . CESAREAN SECTION     X 2  . COLONOSCOPY  2004 , 2010   negative X 2 , Dr Collene Mares  . GANGLIONECTOMY    . THYROID NODULE RESECTED  2005   1 LOBE REMOVED; benign follicular nodule  . TONSILLECTOMY      OB History    No data available       Home Medications    Prior to Admission medications   Medication Sig Start Date End Date Taking? Authorizing Provider  ibuprofen (ADVIL,MOTRIN) 200 MG tablet Take 800 mg by mouth every 6 (six) hours as needed for moderate  pain.   Yes Historical Provider, MD  PARoxetine (PAXIL) 10 MG tablet Take 1 tablet (10 mg total) by mouth every morning. 11/25/14  Yes Hendricks Limes, MD  pravastatin (PRAVACHOL) 40 MG tablet Take 1 tablet (40 mg total) by mouth daily. Patient not taking: Reported on 12/03/2015 11/25/14   Hendricks Limes, MD    Family History Family History  Problem Relation Age of Onset  . ALS Mother   . Alcohol abuse Father   . Acromegaly Brother   . Prostate cancer Maternal Uncle     X 3  . Colon cancer Maternal Grandmother   . Heart attack Maternal Grandmother 80  . Prostate cancer Maternal Grandfather   . Heart attack Maternal Grandfather 82  . Diabetes Neg Hx   . Stroke Neg Hx     Social History Social History  Substance Use Topics  . Smoking status: Never Smoker  . Smokeless tobacco: Never Used  . Alcohol use No     Allergies   Sulfonamide derivatives   Review of Systems Review of Systems  HENT: Negative for congestion.   Neurological: Positive for headaches.  All other systems reviewed and are negative.  Physical Exam Updated Vital Signs BP 124/84   Pulse 65   Temp 98.1 F (36.7 C) (Oral)   Resp 17   Ht 5\' 5"  (  1.651 m)   Wt 65.4 kg   SpO2 99%   BMI 24.00 kg/m   Physical Exam  Constitutional: She appears well-developed and well-nourished.  Eyes: Pupils are equal, round, and reactive to light.  Neck: Normal range of motion.  Cardiovascular: Normal rate.   Pulmonary/Chest: Effort normal.  Abdominal: Soft.  Musculoskeletal: Normal range of motion.  Neurological: She is alert.  Skin: Skin is warm and dry. No rash noted.  Psychiatric: She has a normal mood and affect.     ED Treatments / Results  DIAGNOSTIC STUDIES:  Oxygen Saturation is 99% on RA, normal by my interpretation.    COORDINATION OF CARE:  11:46 PM Discussed treatment plan with pt at bedside and pt agreed to plan.  Labs (all labs ordered are listed, but only abnormal results are  displayed) Labs Reviewed  SEDIMENTATION RATE    EKG  EKG Interpretation None       Radiology No results found.  Procedures Procedures (including critical care time)  Medications Ordered in ED Medications - No data to display   Initial Impression / Assessment and Plan / ED Course  I have reviewed the triage vital signs and the nursing notes.  Pertinent labs & imaging results that were available during my care of the patient were reviewed by me and considered in my medical decision making (see chart for details).  Clinical Course        Final Clinical Impressions(s) / ED Diagnoses   Final diagnoses:  Primary stabbing headache    New Prescriptions Discharge Medication List as of 12/03/2015  2:41 AM     I personally performed the services described in this documentation, which was scribed in my presence. The recorded information has been reviewed and is accurate.    Junius Creamer, NP 12/03/15 [place], DO 12/03/15 [id number]    Junius Creamer, NP 12/20/15 2005    Junius Creamer, NP 12/20/15 2006    Deno Etienne, DO 12/20/15 [id number]

## 2015-12-03 LAB — SEDIMENTATION RATE: SED RATE: 6 mm/h (ref 0–22)

## 2015-12-03 NOTE — ED Notes (Signed)
Patient c/o left head pain that began yesterday.  Patient states she has had sharp, shooting head pain in various places on her head for "years."  She has spoken with her PCP about them and he stated the pains were not of concern unless they were always in the same place.  Patient denies N/V/D, fever, dizziness, SOB and changes in vision.  On exam, patient's neuro exam is unremarkable.  She is A&O x4 with family at bedside.    Patient declined IV start.

## 2015-12-03 NOTE — Discharge Instructions (Signed)
From the description of your headache type is appears that you have "ice pick headache" You have been referred to the [facility] for further evaluation and perhaps treatment or therapy recommendations

## 2015-12-27 ENCOUNTER — Ambulatory Visit: Payer: Self-pay | Admitting: Internal Medicine

## 2016-02-06 NOTE — Progress Notes (Signed)
Subjective:    Patient ID: Terri Pena, female    DOB: [birth year], 61 y.o.   MRN: [id number]  HPI She is here to establish with a new pcp.   She is here for a physical exam.   Cough: for a couple of months.  It is intermittent and there is no apparent cause.  It occurs randomly.  She clears her throat.  She feels GERD 2-3 times a week.  She take a natural remedy when she has it and it helps.   She has a mild rash.  It does itch.  She had it a couple of years ago and it was her detergent.  She has not tried switching her detergent, but will do that.  She denies any other obvious change in products, etc that may have caused it.  She has not taken anything for it.   Hyperlipidemia:  She stopped the pravastatin - she denied side effects.  She wants to work on natural ways first.  She has done some research and feels heart disease is more likely related to sugar and poor diet that causes inflammation in the arteries.    Medications and allergies reviewed with patient and updated if appropriate.  Patient Active Problem List   Diagnosis Date Noted  . Cough 02/07/2016  . Hx of skin cancer, basal cell 11/03/2012  . Osteopenia 10/30/2011  . Menopausal and postmenopausal disorder 10/04/2009  . History of thyroid nodule 08/24/2007  . Hyperlipidemia 08/05/2006    Current Outpatient Prescriptions on File Prior to Visit  Medication Sig Dispense Refill  . ibuprofen (ADVIL,MOTRIN) 200 MG tablet Take 800 mg by mouth every 6 (six) hours as needed for moderate pain.    Marland Kitchen PARoxetine (PAXIL) 10 MG tablet Take 1 tablet (10 mg total) by mouth every morning. 90 tablet 1   No current facility-administered medications on file prior to visit.     Past Medical History:  Diagnosis Date  . Hyperlipidemia    LDL goal = < 160  . Thyroid nodule [id number]   benign follicular nodule , Dr Margot Chimes    Past Surgical History:  Procedure Laterality Date  . CESAREAN SECTION     X 2  . COLONOSCOPY  2004 , 2010   negative X 2 , Dr Collene Mares  . GANGLIONECTOMY    . THYROID NODULE RESECTED  2005   1 LOBE REMOVED; benign follicular nodule  . TONSILLECTOMY      Social History   Social History  . Marital status: Married    Spouse name: N/A  . Number of children: N/A  . Years of education: N/A   Occupational History  . [employer]    Social History Main Topics  . Smoking status: Never Smoker  . Smokeless tobacco: Never Used  . Alcohol use Yes     Comment: rare  . Drug use: No  . Sexual activity: Not Asked   Other Topics Concern  . None   Social History Narrative   Decreased fried foods & sweets   Regular exercise in warmer weather          Family History  Problem Relation Age of Onset  . ALS Mother   . Alcohol abuse Father   . Acromegaly Brother   . Prostate cancer Maternal Uncle     X 3  . Colon cancer Maternal Grandmother   . Heart attack Maternal Grandmother 80  . Prostate cancer Maternal Grandfather   . Heart attack Maternal  Grandfather 82  . Diabetes Neg Hx   . Stroke Neg Hx     Review of Systems  Constitutional: Negative for chills and fever.  HENT: Positive for postnasal drip. Negative for trouble swallowing.   Eyes: Negative for visual disturbance.  Respiratory: Positive for cough. Negative for shortness of breath and wheezing.   Gastrointestinal: Negative for abdominal pain, blood in stool, constipation, diarrhea and nausea.       GERD 2-3 times a week  Genitourinary: Negative for dysuria and hematuria.  Musculoskeletal: Negative for arthralgias, back pain and myalgias.  Skin: Positive for rash. Negative for color change.  Neurological: Negative for light-headedness and headaches.  Psychiatric/Behavioral: Negative for dysphoric mood. The patient is not nervous/anxious.        Objective:   Vitals:   02/07/16 1017  BP: 122/84  Pulse: 71  Resp: 16  Temp: 98.3 F (36.8 C)   Filed Weights   02/07/16 1017  Weight: 138 lb (62.6 kg)   Body mass index  is 22.96 kg/m.  Wt Readings from Last 3 Encounters:  02/07/16 138 lb (62.6 kg)  12/02/15 144 lb 4 oz (65.4 kg)  11/25/14 137 lb (62.1 kg)     Physical Exam Constitutional: She appears well-developed and well-nourished. No distress.  HENT:  Head: Normocephalic and atraumatic.  Right Ear: External ear normal. Normal ear canal and TM Left Ear: External ear normal.  Normal ear canal and TM Mouth/Throat: Oropharynx is clear and moist.  Eyes: Conjunctivae and EOM are normal.  Neck: Neck supple. No tracheal deviation present. No thyromegaly present.  No carotid bruit  Cardiovascular: Normal rate, regular rhythm and normal heart sounds.   No murmur heard.  No edema. Pulmonary/Chest: Effort normal and breath sounds normal. No respiratory distress. She has no wheezes. She has no rales.  Breast: deferred to Gyn Abdominal: Soft. She exhibits no distension. There is no tenderness.  Lymphadenopathy: She has no cervical adenopathy.  Skin: Skin is warm and dry. She is not diaphoretic.  Psychiatric: She has a normal mood and affect. Her behavior is normal.         Assessment & Plan:   Physical exam: Screening blood work ordered Immunizations Up to date  - deferred flu vaccine Colonoscopy - Dr Collene Mares - 2-3 years ago,  Mammogram  Up to date  Gyn  Up to date  Eye exams   Up to date  Exercise - only exercises in warmer weather - stressed regular exercise Weight  Normal BMI Skin   - rash - ? Allergy to detergent - will switch detergents and see if that helps Substance abuse  none  See Problem List for Assessment and Plan of chronic medical problems.  FU annually

## 2016-02-07 ENCOUNTER — Ambulatory Visit (INDEPENDENT_AMBULATORY_CARE_PROVIDER_SITE_OTHER): Payer: Self-pay | Admitting: Internal Medicine

## 2016-02-07 ENCOUNTER — Encounter: Payer: Self-pay | Admitting: Internal Medicine

## 2016-02-07 ENCOUNTER — Other Ambulatory Visit (INDEPENDENT_AMBULATORY_CARE_PROVIDER_SITE_OTHER): Payer: Self-pay

## 2016-02-07 VITALS — BP 122/84 | HR 71 | Temp 98.3°F | Resp 16 | Ht 65.0 in | Wt 138.0 lb

## 2016-02-07 DIAGNOSIS — Z0000 Encounter for general adult medical examination without abnormal findings: Secondary | ICD-10-CM

## 2016-02-07 DIAGNOSIS — R05 Cough: Secondary | ICD-10-CM

## 2016-02-07 DIAGNOSIS — N959 Unspecified menopausal and perimenopausal disorder: Secondary | ICD-10-CM

## 2016-02-07 DIAGNOSIS — R059 Cough, unspecified: Secondary | ICD-10-CM

## 2016-02-07 DIAGNOSIS — M85869 Other specified disorders of bone density and structure, unspecified lower leg: Secondary | ICD-10-CM

## 2016-02-07 LAB — CBC WITH DIFFERENTIAL/PLATELET
Basophils Absolute: 0.1 10*3/uL (ref 0.0–0.1)
Basophils Relative: 1.2 % (ref 0.0–3.0)
EOS ABS: 0.1 10*3/uL (ref 0.0–0.7)
Eosinophils Relative: 2.2 % (ref 0.0–5.0)
HCT: 40.8 % (ref 36.0–46.0)
Hemoglobin: 13.8 g/dL (ref 12.0–15.0)
LYMPHS ABS: 1.9 10*3/uL (ref 0.7–4.0)
Lymphocytes Relative: 41.6 % (ref 12.0–46.0)
MCHC: 33.9 g/dL (ref 30.0–36.0)
MCV: 90 fl (ref 78.0–100.0)
Monocytes Absolute: 0.4 10*3/uL (ref 0.1–1.0)
Monocytes Relative: 9.7 % (ref 3.0–12.0)
NEUTROS PCT: 45.3 % (ref 43.0–77.0)
Neutro Abs: 2 10*3/uL (ref 1.4–7.7)
Platelets: 335 10*3/uL (ref 150.0–400.0)
RBC: 4.53 Mil/uL (ref 3.87–5.11)
RDW: 13.9 % (ref 11.5–15.5)
WBC: 4.5 10*3/uL (ref 4.0–10.5)

## 2016-02-07 LAB — TSH: TSH: 2.08 u[IU]/mL (ref 0.35–4.50)

## 2016-02-07 LAB — LIPID PANEL
CHOL/HDL RATIO: 4
Cholesterol: 277 mg/dL — ABNORMAL HIGH (ref 0–200)
HDL: 68.1 mg/dL (ref >39.00)
LDL Cholesterol: 191 mg/dL — ABNORMAL HIGH (ref 0–99)
NONHDL: 209.05
Triglycerides: 91 mg/dL (ref 0.0–149.0)
VLDL: 18.2 mg/dL (ref 0.0–40.0)

## 2016-02-07 LAB — COMPREHENSIVE METABOLIC PANEL
ALBUMIN: 3.5 g/dL (ref 3.5–5.2)
ALT: 20 U/L (ref 0–35)
AST: 18 U/L (ref 0–37)
Alkaline Phosphatase: 58 U/L (ref 39–117)
BUN: 11 mg/dL (ref 6–23)
CO2: 31 meq/L (ref 19–32)
Calcium: 8.8 mg/dL (ref 8.4–10.5)
Chloride: 105 mEq/L (ref 96–112)
Creatinine, Ser: 0.67 mg/dL (ref 0.40–1.20)
GFR: 94.74 mL/min (ref >60.00)
GLUCOSE: 97 mg/dL (ref 70–99)
POTASSIUM: 4.2 meq/L (ref 3.5–5.1)
SODIUM: 140 meq/L (ref 135–145)
Total Bilirubin: 0.6 mg/dL (ref 0.2–1.2)
Total Protein: 6.2 g/dL (ref 6.0–8.3)

## 2016-02-07 NOTE — Patient Instructions (Addendum)
Try claritin or allegra for your cough - if the cough improves the cough may be due to allergies  If that does not help, stop it and try a  Medication for heartburn, such as zantac or pepcid.  Take it for at least one month.    My chart me with any questions.    Test(s) ordered today. Your results will be released to [facility] (or called to you) after review, usually within 72hours after test completion. If any changes need to be made, you will be notified at that same time.  All other Health Maintenance issues reviewed.   All recommended immunizations and age-appropriate screenings are up-to-date or discussed.  No immunizations administered today.   Medications reviewed and updated.  No changes recommended at this time.    Please followup in 1 year for a physical   Health Maintenance, Female Introduction Adopting a healthy lifestyle and getting preventive care can go a long way to promote health and wellness. Talk with your health care provider about what schedule of regular examinations is right for you. This is a good chance for you to check in with your provider about disease prevention and staying healthy. In between checkups, there are plenty of things you can do on your own. Experts have done a lot of research about which lifestyle changes and preventive measures are most likely to keep you healthy. Ask your health care provider for more information. Weight and diet Eat a healthy diet  Be sure to include plenty of vegetables, fruits, low-fat dairy products, and lean protein.  Do not eat a lot of foods high in solid fats, added sugars, or salt.  Get regular exercise. This is one of the most important things you can do for your health.  Most adults should exercise for at least 150 minutes each week. The exercise should increase your heart rate and make you sweat (moderate-intensity exercise).  Most adults should also do strengthening exercises at least twice a week. This is in  addition to the moderate-intensity exercise. Maintain a healthy weight  Body mass index (BMI) is a measurement that can be used to identify possible weight problems. It estimates body fat based on height and weight. Your health care provider can help determine your BMI and help you achieve or maintain a healthy weight.  For females 59 years of age and older:  A BMI below 18.5 is considered underweight.  A BMI of 18.5 to 24.9 is normal.  A BMI of 25 to 29.9 is considered overweight.  A BMI of 30 and above is considered obese. Watch levels of cholesterol and blood lipids  You should start having your blood tested for lipids and cholesterol at 63 years of age, then have this test every 5 years.  You may need to have your cholesterol levels checked more often if:  Your lipid or cholesterol levels are high.  You are older than 63 years of age.  You are at high risk for heart disease. Cancer screening Lung Cancer  Lung cancer screening is recommended for adults 52-67 years old who are at high risk for lung cancer because of a history of smoking.  A yearly low-dose CT scan of the lungs is recommended for people who:  Currently smoke.  Have quit within the past 15 years.  Have at least a 30-pack-year history of smoking. A pack year is smoking an average of one pack of cigarettes a day for 1 year.  Yearly screening should continue until it  has been 15 years since you quit.  Yearly screening should stop if you develop a health problem that would prevent you from having lung cancer treatment. Breast Cancer  Practice breast self-awareness. This means understanding how your breasts normally appear and feel.  It also means doing regular breast self-exams. Let your health care provider know about any changes, no matter how small.  If you are in your 20s or 30s, you should have a clinical breast exam (CBE) by a health care provider every 1-3 years as part of a regular health  exam.  If you are 59 or older, have a CBE every year. Also consider having a breast X-ray (mammogram) every year.  If you have a family history of breast cancer, talk to your health care provider about genetic screening.  If you are at high risk for breast cancer, talk to your health care provider about having an MRI and a mammogram every year.  Breast cancer gene (BRCA) assessment is recommended for women who have family members with BRCA-related cancers. BRCA-related cancers include:  Breast.  Ovarian.  Tubal.  Peritoneal cancers.  Results of the assessment will determine the need for genetic counseling and BRCA1 and BRCA2 testing. Cervical Cancer  Your health care provider may recommend that you be screened regularly for cancer of the pelvic organs (ovaries, uterus, and vagina). This screening involves a pelvic examination, including checking for microscopic changes to the surface of your cervix (Pap test). You may be encouraged to have this screening done every 3 years, beginning at age 28.  For women ages 41-65, health care providers may recommend pelvic exams and Pap testing every 3 years, or they may recommend the Pap and pelvic exam, combined with testing for human papilloma virus (HPV), every 5 years. Some types of HPV increase your risk of cervical cancer. Testing for HPV may also be done on women of any age with unclear Pap test results.  Other health care providers may not recommend any screening for nonpregnant women who are considered low risk for pelvic cancer and who do not have symptoms. Ask your health care provider if a screening pelvic exam is right for you.  If you have had past treatment for cervical cancer or a condition that could lead to cancer, you need Pap tests and screening for cancer for at least 20 years after your treatment. If Pap tests have been discontinued, your risk factors (such as having a new sexual partner) need to be reassessed to determine if  screening should resume. Some women have medical problems that increase the chance of getting cervical cancer. In these cases, your health care provider may recommend more frequent screening and Pap tests. Colorectal Cancer  This type of cancer can be detected and often prevented.  Routine colorectal cancer screening usually begins at 63 years of age and continues through 63 years of age.  Your health care provider may recommend screening at an earlier age if you have risk factors for colon cancer.  Your health care provider may also recommend using home test kits to check for hidden blood in the stool.  A small camera at the end of a tube can be used to examine your colon directly (sigmoidoscopy or colonoscopy). This is done to check for the earliest forms of colorectal cancer.  Routine screening usually begins at age 18.  Direct examination of the colon should be repeated every 5-10 years through 63 years of age. However, you may need to be screened more  often if early forms of precancerous polyps or small growths are found. Skin Cancer  Check your skin from head to toe regularly.  Tell your health care provider about any new moles or changes in moles, especially if there is a change in a mole's shape or color.  Also tell your health care provider if you have a mole that is larger than the size of a pencil eraser.  Always use sunscreen. Apply sunscreen liberally and repeatedly throughout the day.  Protect yourself by wearing long sleeves, pants, a wide-brimmed hat, and sunglasses whenever you are outside. Heart disease, diabetes, and high blood pressure  High blood pressure causes heart disease and increases the risk of stroke. High blood pressure is more likely to develop in:  People who have blood pressure in the high end of the normal range (130-139/85-89 mm Hg).  People who are overweight or obese.  People who are African American.  If you are 26-76 years of age, have your  blood pressure checked every 3-5 years. If you are 35 years of age or older, have your blood pressure checked every year. You should have your blood pressure measured twice-once when you are at a hospital or clinic, and once when you are not at a hospital or clinic. Record the average of the two measurements. To check your blood pressure when you are not at a hospital or clinic, you can use:  An automated blood pressure machine at a pharmacy.  A home blood pressure monitor.  If you are between 66 years and 65 years old, ask your health care provider if you should take aspirin to prevent strokes.  Have regular diabetes screenings. This involves taking a blood sample to check your fasting blood sugar level.  If you are at a normal weight and have a low risk for diabetes, have this test once every three years after 63 years of age.  If you are overweight and have a high risk for diabetes, consider being tested at a younger age or more often. Preventing infection Hepatitis B  If you have a higher risk for hepatitis B, you should be screened for this virus. You are considered at high risk for hepatitis B if:  You were born in a country where hepatitis B is common. Ask your health care provider which countries are considered high risk.  Your parents were born in a high-risk country, and you have not been immunized against hepatitis B (hepatitis B vaccine).  You have HIV or AIDS.  You use needles to inject street drugs.  You live with someone who has hepatitis B.  You have had sex with someone who has hepatitis B.  You get hemodialysis treatment.  You take certain medicines for conditions, including cancer, organ transplantation, and autoimmune conditions. Hepatitis C  Blood testing is recommended for:  Everyone born from [birth year] through 1967.  Anyone with known risk factors for hepatitis C. Sexually transmitted infections (STIs)  You should be screened for sexually transmitted  infections (STIs) including gonorrhea and chlamydia if:  You are sexually active and are younger than 63 years of age.  You are older than 63 years of age and your health care provider tells you that you are at risk for this type of infection.  Your sexual activity has changed since you were last screened and you are at an increased risk for chlamydia or gonorrhea. Ask your health care provider if you are at risk.  If you do not have HIV, but  are at risk, it may be recommended that you take a prescription medicine daily to prevent HIV infection. This is called pre-exposure prophylaxis (PrEP). You are considered at risk if:  You are sexually active and do not regularly use condoms or know the HIV status of your partner(s).  You take drugs by injection.  You are sexually active with a partner who has HIV. Talk with your health care provider about whether you are at high risk of being infected with HIV. If you choose to begin PrEP, you should first be tested for HIV. You should then be tested every 3 months for as long as you are taking PrEP. Pregnancy  If you are premenopausal and you may become pregnant, ask your health care provider about preconception counseling.  If you may become pregnant, take 400 to 800 micrograms (mcg) of folic acid every day.  If you want to prevent pregnancy, talk to your health care provider about birth control (contraception). Osteoporosis and menopause  Osteoporosis is a disease in which the bones lose minerals and strength with aging. This can result in serious bone fractures. Your risk for osteoporosis can be identified using a bone density scan.  If you are 12 years of age or older, or if you are at risk for osteoporosis and fractures, ask your health care provider if you should be screened.  Ask your health care provider whether you should take a calcium or vitamin D supplement to lower your risk for osteoporosis.  Menopause may have certain physical  symptoms and risks.  Hormone replacement therapy may reduce some of these symptoms and risks. Talk to your health care provider about whether hormone replacement therapy is right for you. Follow these instructions at home:  Schedule regular health, dental, and eye exams.  Stay current with your immunizations.  Do not use any tobacco products including cigarettes, chewing tobacco, or electronic cigarettes.  If you are pregnant, do not drink alcohol.  If you are breastfeeding, limit how much and how often you drink alcohol.  Limit alcohol intake to no more than 1 drink per day for nonpregnant women. One drink equals 12 ounces of beer, 5 ounces of wine, or 1 ounces of hard liquor.  Do not use street drugs.  Do not share needles.  Ask your health care provider for help if you need support or information about quitting drugs.  Tell your health care provider if you often feel depressed.  Tell your health care provider if you have ever been abused or do not feel safe at home. This information is not intended to replace advice given to you by your health care provider. Make sure you discuss any questions you have with your health care provider. Document Released: 07/09/2010 Document Revised: 06/01/2015 Document Reviewed: 09/27/2014  2017 [employer]

## 2016-02-07 NOTE — Assessment & Plan Note (Signed)
Discussed possible causes - PND, dry air, GERD No infection present She will try taking an allergy medication for one month and seeing if that helps - if no improvement consider pepcid or zantac for 4 weeks Contact me with questions

## 2016-02-07 NOTE — Progress Notes (Signed)
Pre visit review using our clinic review tool, if applicable. No additional management support is needed unless otherwise documented below in the visit note. 

## 2016-02-07 NOTE — Assessment & Plan Note (Signed)
Controlled with paxil

## 2016-02-07 NOTE — Assessment & Plan Note (Signed)
dexa done 2014 - due - will hold off until next year Stressed regular exercise

## 2016-04-18 ENCOUNTER — Other Ambulatory Visit: Payer: Self-pay | Admitting: Obstetrics and Gynecology

## 2016-04-18 DIAGNOSIS — Z1231 Encounter for screening mammogram for malignant neoplasm of breast: Secondary | ICD-10-CM

## 2016-05-13 ENCOUNTER — Ambulatory Visit
Admission: RE | Admit: 2016-05-13 | Discharge: 2016-05-13 | Disposition: A | Discharge status: Home / Self Care | Payer: No Typology Code available for payment source | Source: Ambulatory Visit | Attending: Obstetrics and Gynecology | Admitting: Obstetrics and Gynecology

## 2016-05-13 DIAGNOSIS — Z1231 Encounter for screening mammogram for malignant neoplasm of breast: Secondary | ICD-10-CM

## 2016-12-06 ENCOUNTER — Encounter: Payer: Self-pay | Admitting: Family Medicine

## 2016-12-06 ENCOUNTER — Ambulatory Visit: Payer: Self-pay | Admitting: Family Medicine

## 2016-12-06 VITALS — BP 110/62 | HR 78 | Temp 98.8°F | Ht 64.75 in | Wt 141.8 lb

## 2016-12-06 DIAGNOSIS — M6530 Trigger finger, unspecified finger: Secondary | ICD-10-CM | POA: Insufficient documentation

## 2016-12-06 DIAGNOSIS — N951 Menopausal and female climacteric states: Secondary | ICD-10-CM

## 2016-12-06 DIAGNOSIS — Z8639 Personal history of other endocrine, nutritional and metabolic disease: Secondary | ICD-10-CM

## 2016-12-06 DIAGNOSIS — Z0000 Encounter for general adult medical examination without abnormal findings: Secondary | ICD-10-CM

## 2016-12-06 DIAGNOSIS — M65341 Trigger finger, right ring finger: Secondary | ICD-10-CM

## 2016-12-06 DIAGNOSIS — K635 Polyp of colon: Secondary | ICD-10-CM

## 2016-12-06 DIAGNOSIS — Z800 Family history of malignant neoplasm of digestive organs: Secondary | ICD-10-CM | POA: Insufficient documentation

## 2016-12-06 HISTORY — DX: Family history of malignant neoplasm of digestive organs: Z80.0

## 2016-12-06 LAB — LIPID PANEL
Cholesterol: 293 mg/dL — ABNORMAL HIGH (ref 0–200)
HDL: 83.2 mg/dL (ref >39.00)
LDL Cholesterol: 195 mg/dL — ABNORMAL HIGH (ref 0–99)
NonHDL: 209.6
TRIGLYCERIDES: 71 mg/dL (ref 0.0–149.0)
Total CHOL/HDL Ratio: 4
VLDL: 14.2 mg/dL (ref 0.0–40.0)

## 2016-12-06 LAB — COMPREHENSIVE METABOLIC PANEL
ALK PHOS: 71 U/L (ref 39–117)
ALT: 13 U/L (ref 0–35)
AST: 17 U/L (ref 0–37)
Albumin: 3.5 g/dL (ref 3.5–5.2)
BUN: 12 mg/dL (ref 6–23)
CALCIUM: 9 mg/dL (ref 8.4–10.5)
CO2: 31 mEq/L (ref 19–32)
Chloride: 105 mEq/L (ref 96–112)
Creatinine, Ser: 0.68 mg/dL (ref 0.40–1.20)
GFR: 92.88 mL/min (ref >60.00)
Glucose, Bld: 88 mg/dL (ref 70–99)
Potassium: 3.9 mEq/L (ref 3.5–5.1)
Sodium: 141 mEq/L (ref 135–145)
TOTAL PROTEIN: 5.9 g/dL — AB (ref 6.0–8.3)
Total Bilirubin: 0.6 mg/dL (ref 0.2–1.2)

## 2016-12-06 LAB — CBC WITH DIFFERENTIAL/PLATELET
BASOS PCT: 1 % (ref 0.0–3.0)
Basophils Absolute: 0 10*3/uL (ref 0.0–0.1)
EOS PCT: 3.8 % (ref 0.0–5.0)
Eosinophils Absolute: 0.1 10*3/uL (ref 0.0–0.7)
HCT: 42.1 % (ref 36.0–46.0)
Hemoglobin: 13.9 g/dL (ref 12.0–15.0)
Lymphocytes Relative: 36.1 % (ref 12.0–46.0)
Lymphs Abs: 1.4 10*3/uL (ref 0.7–4.0)
MCHC: 33.1 g/dL (ref 30.0–36.0)
MCV: 92.1 fl (ref 78.0–100.0)
MONOS PCT: 9.2 % (ref 3.0–12.0)
Monocytes Absolute: 0.4 10*3/uL (ref 0.1–1.0)
Neutro Abs: 2 10*3/uL (ref 1.4–7.7)
Neutrophils Relative %: 49.9 % (ref 43.0–77.0)
Platelets: 321 10*3/uL (ref 150.0–400.0)
RBC: 4.57 Mil/uL (ref 3.87–5.11)
RDW: 13.5 % (ref 11.5–15.5)
WBC: 3.9 10*3/uL — ABNORMAL LOW (ref 4.0–10.5)

## 2016-12-06 LAB — TSH: TSH: 2.41 u[IU]/mL (ref 0.35–4.50)

## 2016-12-06 NOTE — Progress Notes (Signed)
Subjective  Chief Complaint  Patient presents with  . Annual Exam    Patient is here today for a CPE.  She gets her PAP's done by Dr. Radene Knee her GYN.  Her Mammogram and Dexa will be ordered by him as well.  She declines flu shot.  She is currently fasting.    HPI: Terri Pena is a 61 y.o. female who presents to [facility] today for a Female Wellness Visit. She is a new patient  Wellness Visit: annual visit with health maintenance review and exam without Pap   Healthy 61 year old female here for complete physical and establish care.  Overall she does very well.  She is on low-dose Paxil per her gynecologist for postmenopausal hot flashes and irritability.  It works well.  She has been on this since age 32.  Reviewed her past medical history which is significant for history of thyroid nodule status post thyroid noduleectomy.  Her thyroid function has always been normal.  She does not feel any neck masses nor does she have symptoms of low or high thyroid.  Family history of colon cancer in a grandmother for which she gets 5-year colonoscopies.  She does have a history of a benign colon polyp.  Her female wellness is done by her gynecologist and is up-to-date.  She has a history of osteopenia.  She does not take calcium or vitamin D, she does not exercise regularly.  She refuses flu vaccination. Lifestyle: Body mass index is 23.78 kg/m. Wt Readings from Last 3 Encounters:  12/06/16 141 lb 12.8 oz (64.3 kg)  02/07/16 138 lb (62.6 kg)  12/02/15 144 lb 4 oz (65.4 kg)   Diet: low fat Exercise: intermittently, walking  Patient Active Problem List   Diagnosis Date Noted  . Family history of colon cancer 12/06/2016  . Benign colon polyp 12/06/2016  . Trigger finger of right hand 12/06/2016  . Hx of skin cancer, basal cell 11/03/2012  . Osteopenia 10/30/2011  . Menopausal and postmenopausal disorder 10/04/2009  . History of thyroid nodule 08/24/2007  .  Hyperlipidemia 08/05/2006   Health Maintenance  Topic Date Due  . INFLUENZA VACCINE  08/08/2017 (Originally 08/07/2016)  . DEXA SCAN  12/06/2017 (Originally 11/07/2015)  . PAP SMEAR  01/16/2018  . MAMMOGRAM  05/14/2018  . COLONOSCOPY  06/01/2019  . TETANUS/TDAP  01/27/2023  . Hepatitis C Screening  Completed  . HIV Screening  Completed   Immunization History  Administered Date(s) Administered  . Influenza Split 10/09/2010, 10/30/2011  . Influenza Whole 10/04/2009  . Influenza,inj,Quad PF,6+ Mos 11/03/2012  . Td 02/09/2001  . Tdap 01/26/2013  . Zoster 01/16/2011   We updated and reviewed the patient's past history in detail and it is documented below. Allergies: Patient  reports that she drinks alcohol. Past Medical History Patient  has a past medical history of Family history of colon cancer (12/06/2016), Hyperlipidemia, Postmenopausal disorder, and Thyroid nodule (2005). Past Surgical History Patient  has a past surgical history that includes THYROID NODULE RESECTED (2005); Tonsillectomy; GANGLIONECTOMY; Colonoscopy (2004 , 2010); and Cesarean section. Social History   Socioeconomic History  . Marital status: Married    Spouse name: triana coover  . Number of children: 2  . Years of education: None  . Highest education level: None  Social Needs  . Financial resource strain: None  . Food insecurity - worry: None  . Food insecurity - inability: None  . Transportation needs - medical: None  . Transportation needs -  non-medical: None  Occupational History  . 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 Smoking status: Never Smoker  . Smokeless tobacco: Never Used  Substance and Sexual Activity  . Alcohol use: Yes    Comment: rare  . Drug use: No  . Sexual activity: None  Other Topics Concern  . None  Social History Narrative   Decreased fried foods & sweets   Regular exercise in warmer weather      Family History  Problem Relation Age of Onset  . ALS  Mother   . Hyperlipidemia Mother   . Alcohol abuse Father   . Hyperlipidemia Father   . Acromegaly Brother   . Prostate cancer Maternal Uncle        X 3  . Colon cancer Maternal Grandmother   . Heart attack Maternal Grandmother 80  . Prostate cancer Maternal Grandfather   . Heart attack Maternal Grandfather 82  . Diabetes Neg Hx   . Stroke Neg Hx   . Breast cancer Neg Hx     Review of Systems: Constitutional: negative for fever or malaise Ophthalmic: negative for photophobia, double vision or loss of vision Cardiovascular: negative for chest pain, dyspnea on exertion, or new LE swelling Respiratory: negative for SOB or persistent cough Gastrointestinal: negative for abdominal pain, change in bowel habits or melena Genitourinary: negative for dysuria or gross hematuria, no abnormal uterine bleeding or disharge Musculoskeletal: negative for new gait disturbance or muscular weakness, positive for occasional pain at base of thumbs and right fourth trigger finger Integumentary: negative for new or persistent rashes, no breast lumps Neurological: negative for TIA or stroke symptoms Psychiatric: negative for SI or delusions Allergic/Immunologic: negative for hives  Patient Care Team    Relationship Specialty Notifications Start End  Leamon Arnt, MD PCP - General Family Medicine  12/06/16   Arvella Nigh, MD Consulting Physician Obstetrics and Gynecology  12/06/16     Objective  Vitals: BP 110/62 (BP Location: Left Arm, Patient Position: Sitting, Cuff Size: Normal)   Pulse 78   Temp 98.8 F (37.1 C) (Oral)   Ht 5' 4.75" (1.645 m)   Wt 141 lb 12.8 oz (64.3 kg)   SpO2 94%   BMI 23.78 kg/m  General:  Well developed, well nourished, no acute distress  Psych:  Alert and orientedx3,normal mood and affect HEENT:  Normocephalic, atraumatic, non-icteric sclera, PERRL, oropharynx is clear without mass or exudate, supple neck without adenopathy, mass or thyromegaly Cardiovascular:   Normal S1, S2, RRR without gallop, rub or murmur, nondisplaced PMI Respiratory:  Good breath sounds bilaterally, CTAB with normal respiratory effort Gastrointestinal: normal bowel sounds, soft, non-tender, no noted masses. No HSM MSK: no deformities, contusions. Joints are without erythema or swelling. Spine and CVA region are nontender, right fourth metacarpal with tender nodule without triggering now. Skin:  Warm, no rashes or suspicious lesions noted, multiple benign-appearing moles and freckles Neurologic:    Mental status is normal. CN 2-11 are normal. Gross motor and sensory exams are normal. Normal gait. No tremor  Assessment  1. Annual physical exam   2. Menopausal and postmenopausal disorder   3. Family history of colon cancer   4. Benign colon polyp   5. Trigger ring finger of right hand   6. History of thyroid nodule      Plan  Female Wellness Visit:  Age appropriate Health Maintenance and Prevention measures were discussed with patient. Included topics are cancer screening recommendations, ways to keep healthy (see  AVS) including dietary and exercise recommendations, regular eye and dental care, use of seat belts, and avoidance of moderate alcohol use and tobacco use.  Cancer screening is up-to-date.  Mammogram is to be scheduled.  BMI: discussed patient's BMI and encouraged positive lifestyle modifications to help get to or maintain a target BMI.  HM needs and immunizations were addressed and ordered. See below for orders. See HM and immunization section for updates.  Routine labs and screening tests ordered including cmp, cbc and lipids where appropriate.  Discussed recommendations regarding Vit D and calcium supplementation (see AVS)  Menopausal disorder on Paxil is well controlled  Discussed osteopenia and recommend vitamin D and calcium supplementation and weightbearing exercise.  Discussed care of trigger finger.  Patient will return for steroid injection if  worsens.  History of thyroid nodule, TSH pending.  No palpable masses today.  Follow up: Return in about 12 months (around 12/06/2017) for your complete physical, please come fasting.   Commons side effects, risks, benefits, and alternatives for medications and treatment plan prescribed today were discussed, and the patient expressed understanding of the given instructions. Patient is instructed to call or message via MyChart if he/she has any questions or concerns regarding our treatment plan. No barriers to understanding were identified. We discussed Red Flag symptoms and signs in detail. Patient expressed understanding regarding what to do in case of urgent or emergency type symptoms.   Medication list was reconciled, printed and provided to the patient in AVS. Patient instructions and summary information was reviewed with the patient as documented in the AVS. This note was prepared with assistance of Dragon voice recognition software. Occasional wrong-word or sound-a-like substitutions may have occurred due to the inherent limitations of voice recognition software  Orders Placed This Encounter  Procedures  . CBC with Differential/Platelet  . Comprehensive metabolic panel  . Lipid panel  . HIV antibody  . TSH   No orders of the defined types were placed in this encounter.

## 2016-12-06 NOTE — Patient Instructions (Addendum)
Start taking calcium: 600mg  twice day, Vit D [phone] units daily. Caltrate plus twice a day.  It was a pleasure meeting you! Thank you for choosing [place] to meet your healthcare needs! I truly look forward to working with you. Return in a year for your annual physical.  Please do these things to maintain good health!   Exercise at least 30-45 minutes a day,  4-5 days a week.   Eat a low-fat diet with lots of fruits and vegetables, up to 7-9 servings per day.  Drink plenty of water daily. Try to drink 8 8oz glasses per day.  Seatbelts can save your life. Always wear your seatbelt.  Place Smoke Detectors on every level of your home and check batteries every year.  Schedule an appointment with an eye doctor for an eye exam every 1-2 years  Safe sex - use condoms to protect yourself from STDs if you could be exposed to these types of infections. Use birth control if you do not want to become pregnant and are sexually active.  Avoid heavy alcohol use. If you drink, keep it to less than 2 drinks/day and not every day.  [place].  Choose someone you trust that could speak for you if you became unable to speak for yourself.  Depression is common in our stressful world.If you're feeling down or losing interest in things you normally enjoy, please come in for a visit.  If anyone is threatening or hurting you, please get help. Physical or Emotional Violence is never OK.

## 2016-12-07 LAB — HIV ANTIBODY (ROUTINE TESTING W REFLEX): HIV 1&2 Ab, 4th Generation: NONREACTIVE

## 2016-12-11 ENCOUNTER — Other Ambulatory Visit: Payer: Self-pay | Admitting: Internal Medicine

## 2016-12-11 ENCOUNTER — Encounter: Payer: Self-pay | Admitting: Family Medicine

## 2016-12-11 DIAGNOSIS — E785 Hyperlipidemia, unspecified: Secondary | ICD-10-CM

## 2016-12-12 NOTE — Telephone Encounter (Signed)
Routing to Dr. Jonni Sanger to handle

## 2017-02-04 ENCOUNTER — Telehealth: Payer: Self-pay | Admitting: Family Medicine

## 2017-02-04 ENCOUNTER — Encounter: Payer: Self-pay | Admitting: Family Medicine

## 2017-02-04 NOTE — Telephone Encounter (Signed)
Copied from [place] [id number]. Topic: General - Other >> Feb 04, 2017  4:22 PM Darl Householder, RMA wrote: Reason for CRM: patient is requesting a call back from practice administrator for billing issue for labs on 12/06/2016, please return pt call

## 2017-02-04 NOTE — Telephone Encounter (Signed)
Copied from [place] [id number]. Topic: Inquiry >> Feb 04, 2017  5:14 PM Neva Seat wrote: Pt could not find the copy of the bill of $80 on 12-06-16 in her MyChart.  Pt wants someone to call her back to let her know if she can get the copy emailed to her since it's not on MyChart.

## 2017-02-05 NOTE — Telephone Encounter (Signed)
E-mail pt a copy of receipt for DOB asked.

## 2017-12-10 ENCOUNTER — Ambulatory Visit: Payer: Self-pay | Admitting: Family Medicine

## 2017-12-28 NOTE — Progress Notes (Signed)
[facility] [name], [place] [address] Phone: [phone] Subjective:   Fontaine No, am serving as a scribe for Dr. Hulan Saas.  I'm seeing this patient by the request  of:  Leamon Arnt, MD   CC: Bilateral hip pain  HUD:[id number]  Terri Pena is a 62 y.o. female coming in with complaint of right hip pain. Patient has been having pain for months. 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 She has seen a Restaurant manager, fast food. Pain over greater trochanter. Pain at night from hip to knee. Does not use anything to alleviate her pain. Stairs increase pain.  Does not remember any specific injury though.      Past Medical History:  Diagnosis Date  . Family history of colon cancer 12/06/2016   MGM - q 5 year colonoscopy with Dr. Collene Mares  . Hyperlipidemia    LDL goal = < 160  . Postmenopausal disorder   . Thyroid nodule 5885   benign follicular nodule , Dr Margot Chimes   Past Surgical History:  Procedure Laterality Date  . CESAREAN SECTION     X 2  . COLONOSCOPY  2004 , 2010   negative X 2 , Dr Collene Mares  . GANGLIONECTOMY    . THYROID NODULE RESECTED  2005   1 LOBE REMOVED; benign follicular nodule  . TONSILLECTOMY     Social History   Socioeconomic History  . Marital status: Married    Spouse name: emika tiano  . Number of children: 2  . Years of education: Not on file  . Highest education level: Not on file  Occupational History  . 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 Financial resource strain: Not on file  . Food insecurity:    Worry: Not on file    Inability: Not on file  . Transportation needs:    Medical: Not on file    Non-medical: Not on file  Tobacco Use  . Smoking status: Never Smoker  . Smokeless tobacco: Never Used  Substance and Sexual Activity  . Alcohol use: Yes    Comment: rare  . Drug use: No  . Sexual activity: Not on file  Lifestyle  .  Physical activity:    Days per week: Not on file    Minutes per session: Not on file  . Stress: Not on file  Relationships  . Social connections:    Talks on phone: Not on file    Gets together: Not on file    Attends religious service: Not on file    Active member of club or organization: Not on file    Attends meetings of clubs or organizations: Not on file    Relationship status: Not on file  Other Topics Concern  . Not on file  Social History Narrative   Decreased fried foods & sweets   Regular exercise in warmer weather      Allergies  Allergen Reactions  . Sulfonamide Derivatives Itching and Rash    Rash & itching Because of a history of documented adverse serious drug reaction;Medi Alert bracelet  is recommended   Family History  Problem Relation Age of Onset  . ALS Mother   . Hyperlipidemia Mother   . Alcohol abuse Father   . Hyperlipidemia Father   . Acromegaly Brother   . Prostate cancer Maternal Uncle  X 3  . Colon cancer Maternal Grandmother   . Heart attack Maternal Grandmother 80  . Prostate cancer Maternal Grandfather   . Heart attack Maternal Grandfather 82  . Diabetes Neg Hx   . Stroke Neg Hx   . Breast cancer Neg Hx      Current Outpatient Medications (Cardiovascular):  .  pravastatin (PRAVACHOL) 40 MG tablet, Take 1 tablet (40 mg total) by mouth at bedtime.   Current Outpatient Medications (Analgesics):  .  ibuprofen (ADVIL,MOTRIN) 200 MG tablet, Take 800 mg by mouth every 6 (six) hours as needed for moderate pain.   Current Outpatient Medications (Other):  Marland Kitchen  PARoxetine (PAXIL) 10 MG tablet, Take 1 tablet (10 mg total) by mouth every morning.    Past medical history, social, surgical and family history all reviewed in electronic medical record.  No pertanent information unless stated regarding to the chief complaint.   Review of Systems:  No headache, visual changes, nausea, vomiting, diarrhea, constipation, dizziness, abdominal  pain, skin rash, fevers, chills, night sweats, weight loss, swollen lymph nodes, body aches, joint swelling,  chest pain, shortness of breath, mood changes.  Positive muscle aches  Objective  Blood pressure 90/74, pulse 85, height 5\' 4"  (1.626 m), weight 143 lb (64.9 kg), SpO2 96 %.   General: No apparent distress alert and oriented x3 mood and affect normal, dressed appropriately.  HEENT: Pupils equal, extraocular movements intact  Respiratory: Patient's speak in full sentences and does not appear short of breath  Cardiovascular: No lower extremity edema, non tender, no erythema  Skin: Warm dry intact with no signs of infection or rash on extremities or on axial skeleton.  Abdomen: Soft nontender  Neuro: Cranial nerves II through XII are intact, neurovascularly intact in all extremities with 2+ DTRs and 2+ pulses.  Lymph: No lymphadenopathy of posterior or anterior cervical chain or axillae bilaterally.  Gait normal with good balance and coordination.  MSK:  Non tender with full range of motion and good stability and symmetric strength and tone of shoulders, elbows, wrist, knee and ankles bilaterally.  Hip: Right ROM IR: 45 Deg, ER: 45 Deg, Flexion: 120 Deg, Extension: 100 Deg, Abduction: 45 Deg, Adduction: 45 Deg Strength IR: 5/5, ER: 5/5, Flexion: 5/5, Extension: 5/5, Abduction: 4/5 but symmetric, Adduction: 5/5 Pelvic alignment unremarkable to inspection and palpation. Standing hip rotation and gait without trendelenburg sign / unsteadiness. Greater trochanter with moderate tenderness over the lateral aspect of the hip.  Positive Corky Sox noted. No SI joint tenderness and normal minimal SI movement.  [id number]; 15 additional minutes spent for Therapeutic exercises as stated in above notes.  This included exercises focusing on stretching, strengthening, with significant focus on eccentric aspects.   Long term goals include an improvement in range of motion, strength, endurance as well as  avoiding reinjury. Patient's frequency would include in 1-2 times a day, 3-5 times a week for a duration of 6-12 weeks. Hip strengthening exercises which included:  Pelvic tilt/bracing to help with proper recruitment of the lower abs and pelvic floor muscles  Glute strengthening to properly contract glutes without over-engaging low back and hamstrings - prone hip extension and glute bridge exercises Proper stretching techniques to increase effectiveness for the hip flexors, groin, quads, piriformic and low back when appropriate   Proper technique shown and discussed handout in great detail with ATC.  All questions were discussed and answered.     Impression and Recommendations:     This case required medical decision making  of moderate complexity. The above documentation has been reviewed and is accurate and complete Lyndal Pulley, DO       Note: This dictation was prepared with Dragon dictation along with smaller phrase technology. Any transcriptional errors that result from this process are unintentional.

## 2017-12-29 ENCOUNTER — Ambulatory Visit (INDEPENDENT_AMBULATORY_CARE_PROVIDER_SITE_OTHER): Payer: Self-pay | Admitting: Family Medicine

## 2017-12-29 ENCOUNTER — Ambulatory Visit: Payer: Self-pay

## 2017-12-29 VITALS — BP 90/74 | HR 85 | Ht 64.0 in | Wt 143.0 lb

## 2017-12-29 DIAGNOSIS — M7071 Other bursitis of hip, right hip: Secondary | ICD-10-CM | POA: Insufficient documentation

## 2017-12-29 DIAGNOSIS — M7061 Trochanteric bursitis, right hip: Secondary | ICD-10-CM

## 2017-12-29 DIAGNOSIS — M25551 Pain in right hip: Secondary | ICD-10-CM

## 2017-12-29 NOTE — Patient Instructions (Signed)
Good to see you.  Ice 20 minutes 2 times daily. Usually after activity and before bed. Exercises 3 times a week.  pennsaid pinkie amount topically 2 times daily as needed.  Over the counter get  Vitamin D 2000 IU dialy  Turmeric 500mg  daily  Tart cherry extract 1500mg  at night These should decrease the inflammation from the bursitis.  See em again in 4-5 weeks and if not a lot better then we will do injection

## 2017-12-29 NOTE — Assessment & Plan Note (Signed)
Right hip bursitis.  Home exercise given discussed icing regimen and home exercise.  Discussed topical anti-inflammatories discussed topical anti-inflammatories overall.  If no improvement will consider injection.  Follow-up again in 4 to 6 weeks

## 2018-01-28 ENCOUNTER — Ambulatory Visit: Payer: Self-pay | Admitting: Family Medicine

## 2018-02-18 ENCOUNTER — Ambulatory Visit: Payer: Self-pay | Admitting: Family Medicine

## 2018-03-18 ENCOUNTER — Ambulatory Visit: Payer: Self-pay | Admitting: Family Medicine

## 2018-04-27 ENCOUNTER — Ambulatory Visit: Payer: Self-pay | Admitting: Family Medicine

## 2018-10-09 ENCOUNTER — Other Ambulatory Visit: Payer: Self-pay

## 2018-10-09 DIAGNOSIS — Z20822 Contact with and (suspected) exposure to covid-19: Secondary | ICD-10-CM

## 2018-10-10 LAB — NOVEL CORONAVIRUS, NAA: SARS-CoV-2, NAA: DETECTED — AB

## 2018-11-11 DIAGNOSIS — Z23 Encounter for immunization: Secondary | ICD-10-CM | POA: Diagnosis not present

## 2018-11-11 DIAGNOSIS — L820 Inflamed seborrheic keratosis: Secondary | ICD-10-CM | POA: Diagnosis not present

## 2018-11-11 DIAGNOSIS — D225 Melanocytic nevi of trunk: Secondary | ICD-10-CM | POA: Diagnosis not present

## 2018-11-11 DIAGNOSIS — C4441 Basal cell carcinoma of skin of scalp and neck: Secondary | ICD-10-CM | POA: Diagnosis not present

## 2018-11-11 DIAGNOSIS — D485 Neoplasm of uncertain behavior of skin: Secondary | ICD-10-CM | POA: Diagnosis not present

## 2018-11-11 DIAGNOSIS — L570 Actinic keratosis: Secondary | ICD-10-CM | POA: Diagnosis not present

## 2018-11-11 DIAGNOSIS — Z85828 Personal history of other malignant neoplasm of skin: Secondary | ICD-10-CM | POA: Diagnosis not present

## 2018-11-11 DIAGNOSIS — Z86018 Personal history of other benign neoplasm: Secondary | ICD-10-CM | POA: Diagnosis not present

## 2018-11-30 NOTE — Progress Notes (Signed)
[facility] [place], [place] [address] Phone: [phone] Subjective:   Fontaine No, am serving as a scribe for Dr. Hulan Saas.   CC: Bilateral hip pain  [id number]   12/29/2017 Right hip bursitis.  Home exercise given discussed icing regimen and home exercise.  Discussed topical anti-inflammatories discussed topical anti-inflammatories overall.  If no improvement will consider injection.  Follow-up again in 4 to 6 weeks  Update 12/01/2018 Terri Pena is a 63 y.o. female coming in with complaint of right hip bursitis. Patient states that she is having right hip pain that is over greater trochanter that radiates into the right quad. Pain with lying down at night. Is not using anything for pain. Other hip is also hurting in same spot.     Past Medical History:  Diagnosis Date  . Family history of colon cancer 12/06/2016   MGM - q 5 year colonoscopy with Dr. Collene Mares  . Hyperlipidemia    LDL goal = < 160  . Postmenopausal disorder   . Thyroid nodule [id number]   benign follicular nodule , Dr Margot Chimes   Past Surgical History:  Procedure Laterality Date  . CESAREAN SECTION     X 2  . COLONOSCOPY  2004 , 2010   negative X 2 , Dr Collene Mares  . GANGLIONECTOMY    . THYROID NODULE RESECTED  2005   1 LOBE REMOVED; benign follicular nodule  . TONSILLECTOMY     Social History   Socioeconomic History  . Marital status: Married    Spouse name: avi felver  . Number of children: 2  . Years of education: Not on file  . Highest education level: Not on file  Occupational History  . 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 Financial resource strain: Not on file  . Food insecurity    Worry: Not on file    Inability: Not on file  . Transportation needs    Medical: Not on file    Non-medical: Not on file  Tobacco Use  . Smoking status: Never Smoker  . Smokeless tobacco: Never Used  Substance and Sexual Activity  . Alcohol use: Yes     Comment: rare  . Drug use: No  . Sexual activity: Not on file  Lifestyle  . Physical activity    Days per week: Not on file    Minutes per session: Not on file  . Stress: Not on file  Relationships  . Social Herbalist on phone: Not on file    Gets together: Not on file    Attends religious service: Not on file    Active member of club or organization: Not on file    Attends meetings of clubs or organizations: Not on file    Relationship status: Not on file  Other Topics Concern  . Not on file  Social History Narrative   Decreased fried foods & sweets   Regular exercise in warmer weather      Allergies  Allergen Reactions  . Sulfonamide Derivatives Itching and Rash    Rash & itching Because of a history of documented adverse serious drug reaction;Medi Alert bracelet  is recommended   Family History  Problem Relation Age of Onset  . ALS Mother   . Hyperlipidemia Mother   . Alcohol abuse Father   . Hyperlipidemia Father   . Acromegaly Brother   . Prostate cancer Maternal Uncle  X 3  . Colon cancer Maternal Grandmother   . Heart attack Maternal Grandmother 80  . Prostate cancer Maternal Grandfather   . Heart attack Maternal Grandfather 82  . Diabetes Neg Hx   . Stroke Neg Hx   . Breast cancer Neg Hx      Current Outpatient Medications (Cardiovascular):  .  pravastatin (PRAVACHOL) 40 MG tablet, Take 1 tablet (40 mg total) by mouth at bedtime.   Current Outpatient Medications (Analgesics):  .  ibuprofen (ADVIL,MOTRIN) 200 MG tablet, Take 800 mg by mouth every 6 (six) hours as needed for moderate pain.   Current Outpatient Medications (Other):  Marland Kitchen  PARoxetine (PAXIL) 10 MG tablet, Take 1 tablet (10 mg total) by mouth every morning.    Past medical history, social, surgical and family history all reviewed in electronic medical record.  No pertanent information unless stated regarding to the chief complaint.   Review of Systems:  No headache,  visual changes, nausea, vomiting, diarrhea, constipation, dizziness, abdominal pain, skin rash, fevers, chills, night sweats, weight loss, swollen lymph nodes, body aches, joint swelling,  chest pain, shortness of breath, mood changes.   Objective  Blood pressure 108/78, pulse 76, height 5\' 4"  (1.626 m), weight 145 lb (65.8 kg), SpO2 97 %.   General: No apparent distress alert and oriented x3 mood and affect normal, dressed appropriately.  HEENT: Pupils equal, extraocular movements intact  Respiratory: Patient's speak in full sentences and does not appear short of breath  Cardiovascular: No lower extremity edema, non tender, no erythema  Skin: Warm dry intact with no signs of infection or rash on extremities or on axial skeleton.  Abdomen: Soft nontender  Neuro: Cranial nerves II through XII are intact, neurovascularly intact in all extremities with 2+ DTRs and 2+ pulses.  Lymph: No lymphadenopathy of posterior or anterior cervical chain or axillae bilaterally.  Gait normal with good balance and coordination.  MSK:  tender with full range of motion and good stability and symmetric strength and tone of shoulders, elbows, wrist, knee and ankles bilaterally.   Bilateral hip exam is does have severe tenderness to palpation.  Especially with greater trochanteric area bilaterally.  Positive Corky Sox.  Negative straight leg test.  Mild pain in the paraspinal musculature lumbar spine but no spinous process tenderness.  5-5 strength of the lower extremities bilaterally.   Procedure: Real-time Ultrasound Guided Injection of right greater trochanteric bursitis secondary to patient's body habitus Device: GE Logiq Q7 Ultrasound guided injection is preferred based studies that show increased duration, increased effect, greater accuracy, decreased procedural pain, increased response rate, and decreased cost with ultrasound guided versus blind injection.  Verbal informed consent obtained.  Time-out conducted.   Noted no overlying erythema, induration, or other signs of local infection.  Skin prepped in a sterile fashion.  Local anesthesia: Topical Ethyl chloride.  With sterile technique and under real time ultrasound guidance:  Greater trochanteric area was visualized and patient's bursa was noted. A 22-gauge 3 inch needle was inserted and 4 cc of 0.5% Marcaine and 1 cc of Kenalog 40 mg/dL was injected. Pictures taken Completed without difficulty  Pain immediately resolved suggesting accurate placement of the medication.  Advised to call if fevers/chills, erythema, induration, drainage, or persistent bleeding.  Images permanently stored and available for review in the ultrasound unit.  Impression: Technically successful ultrasound guided injection.   Procedure: Real-time Ultrasound Guided Injection of left  greater trochanteric bursitis secondary to patient's body habitus Device: GE Logiq Q7  Ultrasound  guided injection is preferred based studies that show increased duration, increased effect, greater accuracy, decreased procedural pain, increased response rate, and decreased cost with ultrasound guided versus blind injection.  Verbal informed consent obtained.  Time-out conducted.  Noted no overlying erythema, induration, or other signs of local infection.  Skin prepped in a sterile fashion.  Local anesthesia: Topical Ethyl chloride.  With sterile technique and under real time ultrasound guidance:  Greater trochanteric area was visualized and patient's bursa was noted. A 22-gauge 3 inch needle was inserted and 4 cc of 0.5% Marcaine and 1 cc of Kenalog 40 mg/dL was injected. Pictures taken Completed without difficulty  Pain immediately resolved suggesting accurate placement of the medication.  Advised to call if fevers/chills, erythema, induration, drainage, or persistent bleeding.  Images permanently stored and available for review in the ultrasound unit.  Impression: Technically successful  ultrasound guided injection.     Impression and Recommendations:     This case required medical decision making of moderate complexity. The above documentation has been reviewed and is accurate and complete Lyndal Pulley, DO       Note: This dictation was prepared with Dragon dictation along with smaller phrase technology. Any transcriptional errors that result from this process are unintentional.

## 2018-12-01 ENCOUNTER — Ambulatory Visit: Payer: Self-pay

## 2018-12-01 ENCOUNTER — Encounter: Payer: Self-pay | Admitting: Family Medicine

## 2018-12-01 ENCOUNTER — Other Ambulatory Visit: Payer: Self-pay

## 2018-12-01 ENCOUNTER — Ambulatory Visit (INDEPENDENT_AMBULATORY_CARE_PROVIDER_SITE_OTHER): Payer: PPO | Admitting: Family Medicine

## 2018-12-01 VITALS — BP 108/78 | HR 76 | Ht 64.0 in | Wt 145.0 lb

## 2018-12-01 DIAGNOSIS — M25552 Pain in left hip: Secondary | ICD-10-CM

## 2018-12-01 DIAGNOSIS — M7062 Trochanteric bursitis, left hip: Secondary | ICD-10-CM | POA: Diagnosis not present

## 2018-12-01 DIAGNOSIS — M25551 Pain in right hip: Secondary | ICD-10-CM

## 2018-12-01 DIAGNOSIS — M7061 Trochanteric bursitis, right hip: Secondary | ICD-10-CM

## 2018-12-01 NOTE — Assessment & Plan Note (Signed)
Bilateral injections given today.  Patient given home exercises given, discussed icing regimen, topical anti-inflammatories, discussed which activities to do which was to avoid.  Topical anti-inflammatories over-the-counter suggested.  Follow-up again in 6 to 8 weeks.

## 2018-12-01 NOTE — Patient Instructions (Signed)
Good to see you.  Ice 20 minutes 2 times daily. Usually after activity and before bed. Exercises 3 times a week.  Voltaren gel 2x a day See me again in 6-8 weeks

## 2018-12-11 ENCOUNTER — Telehealth: Payer: Self-pay

## 2018-12-11 NOTE — Telephone Encounter (Signed)
Patient wants referral to Dr. Yong Channel but he is not taking new patients. Will call to tell patient.

## 2019-01-11 DIAGNOSIS — Z1231 Encounter for screening mammogram for malignant neoplasm of breast: Secondary | ICD-10-CM | POA: Diagnosis not present

## 2019-01-11 DIAGNOSIS — Z1329 Encounter for screening for other suspected endocrine disorder: Secondary | ICD-10-CM | POA: Diagnosis not present

## 2019-01-11 DIAGNOSIS — Z01419 Encounter for gynecological examination (general) (routine) without abnormal findings: Secondary | ICD-10-CM | POA: Diagnosis not present

## 2019-01-11 DIAGNOSIS — Z6823 Body mass index (BMI) 23.0-23.9, adult: Secondary | ICD-10-CM | POA: Diagnosis not present

## 2019-01-11 DIAGNOSIS — Z1321 Encounter for screening for nutritional disorder: Secondary | ICD-10-CM | POA: Diagnosis not present

## 2019-01-11 DIAGNOSIS — Z13220 Encounter for screening for lipoid disorders: Secondary | ICD-10-CM | POA: Diagnosis not present

## 2019-01-11 DIAGNOSIS — Z13228 Encounter for screening for other metabolic disorders: Secondary | ICD-10-CM | POA: Diagnosis not present

## 2019-01-19 ENCOUNTER — Encounter: Payer: Self-pay | Admitting: Family Medicine

## 2019-01-19 ENCOUNTER — Ambulatory Visit (INDEPENDENT_AMBULATORY_CARE_PROVIDER_SITE_OTHER): Payer: PPO | Admitting: Family Medicine

## 2019-01-19 ENCOUNTER — Other Ambulatory Visit: Payer: Self-pay

## 2019-01-19 ENCOUNTER — Ambulatory Visit (INDEPENDENT_AMBULATORY_CARE_PROVIDER_SITE_OTHER): Payer: PPO

## 2019-01-19 VITALS — BP 116/80 | HR 83 | Ht 64.0 in | Wt 142.0 lb

## 2019-01-19 DIAGNOSIS — G8929 Other chronic pain: Secondary | ICD-10-CM

## 2019-01-19 DIAGNOSIS — M549 Dorsalgia, unspecified: Secondary | ICD-10-CM

## 2019-01-19 DIAGNOSIS — M25551 Pain in right hip: Secondary | ICD-10-CM | POA: Diagnosis not present

## 2019-01-19 DIAGNOSIS — M7061 Trochanteric bursitis, right hip: Secondary | ICD-10-CM

## 2019-01-19 DIAGNOSIS — M47814 Spondylosis without myelopathy or radiculopathy, thoracic region: Secondary | ICD-10-CM | POA: Diagnosis not present

## 2019-01-19 NOTE — Progress Notes (Signed)
[facility] [address] [place] Phone: [phone] Subjective:   I Terri Pena am serving as a Education administrator for Dr. Hulan Saas.  This visit occurred during the SARS-CoV-2 public health emergency.  Safety protocols were in place, including screening questions prior to the visit, additional usage of staff PPE, and extensive cleaning of exam room while observing appropriate contact time as indicated for disinfecting solutions.     CC: Bilateral hip pain follow-up  [id number]   12/01/2018 Bilateral injections given today.  Patient given home exercises given, discussed icing regimen, topical anti-inflammatories, discussed which activities to do which was to avoid.  Topical anti-inflammatories over-the-counter suggested.  Follow-up again in 6 to 8 weeks.  01/19/2019 Terri Pena is a 64 y.o. female coming in with complaint of bilateral hip pain. Patient states the left is doing well. Right is about 50% better. Patient states that if she does have potentially 1 more injection she would make more improvement.  Patient has been making progress.  Slowly improving overall but has not been doing the exercises regularly.  Not take any other suggestions on a regular basis either.     Past Medical History:  Diagnosis Date  . Family history of colon cancer 12/06/2016   MGM - q 5 year colonoscopy with Dr. Collene Mares  . Hyperlipidemia    LDL goal = < 160  . Postmenopausal disorder   . Thyroid nodule [id number]   benign follicular nodule , Dr Margot Chimes   Past Surgical History:  Procedure Laterality Date  . CESAREAN SECTION     X 2  . COLONOSCOPY  2004 , 2010   negative X 2 , Dr Collene Mares  . GANGLIONECTOMY    . THYROID NODULE RESECTED  2005   1 LOBE REMOVED; benign follicular nodule  . TONSILLECTOMY     Social History   Socioeconomic History  . Marital status: Married    Spouse name: avamarie toscano  . Number of children: 2  . Years of education: Not on file   . Highest education level: Not on file  Occupational History  . 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 Smoking status: Never Smoker  . Smokeless tobacco: Never Used  Substance and Sexual Activity  . Alcohol use: Yes    Comment: rare  . Drug use: No  . Sexual activity: Not on file  Other Topics Concern  . Not on file  Social History Narrative   Decreased fried foods & sweets   Regular exercise in warmer weather      Social Determinants of Health   Financial Resource Strain:   . Difficulty of Paying Living Expenses: Not on file  Food Insecurity:   . Worried About Charity fundraiser in the Last Year: Not on file  . Ran Out of Food in the Last Year: Not on file  Transportation Needs:   . Lack of Transportation (Medical): Not on file  . Lack of Transportation (Non-Medical): Not on file  Physical Activity:   . Days of Exercise per Week: Not on file  . Minutes of Exercise per Session: Not on file  Stress:   . Feeling of Stress : Not on file  Social Connections:   . Frequency of Communication with Friends and Family: Not on file  . Frequency of Social Gatherings with Friends and Family: Not on file  . Attends Religious Services: Not on file  . Active Member of Clubs or Organizations: Not  on file  . Attends Archivist Meetings: Not on file  . Marital Status: Not on file   Allergies  Allergen Reactions  . Sulfonamide Derivatives Itching and Rash    Rash & itching Because of a history of documented adverse serious drug reaction;Medi Alert bracelet  is recommended   Family History  Problem Relation Age of Onset  . ALS Mother   . Hyperlipidemia Mother   . Alcohol abuse Father   . Hyperlipidemia Father   . Acromegaly Brother   . Prostate cancer Maternal Uncle        X 3  . Colon cancer Maternal Grandmother   . Heart attack Maternal Grandmother 80  . Prostate cancer Maternal Grandfather   . Heart attack Maternal Grandfather 82  . Diabetes Neg Hx    . Stroke Neg Hx   . Breast cancer Neg Hx      Current Outpatient Medications (Cardiovascular):  .  pravastatin (PRAVACHOL) 40 MG tablet, Take 1 tablet (40 mg total) by mouth at bedtime.   Current Outpatient Medications (Analgesics):  .  ibuprofen (ADVIL,MOTRIN) 200 MG tablet, Take 800 mg by mouth every 6 (six) hours as needed for moderate pain.   Current Outpatient Medications (Other):  Marland Kitchen  PARoxetine (PAXIL) 10 MG tablet, Take 1 tablet (10 mg total) by mouth every morning.    Past medical history, social, surgical and family history all reviewed in electronic medical record.  No pertanent information unless stated regarding to the chief complaint.   Review of Systems:  No headache, visual changes, nausea, vomiting, diarrhea, constipation, dizziness, abdominal pain, skin rash, fevers, chills, night sweats, weight loss, swollen lymph nodes, body aches, joint swelling, muscle aches, chest pain, shortness of breath, mood changes.   Objective  Blood pressure 116/80, pulse 83, height 5\' 4"  (1.626 m), weight 142 lb (64.4 kg), SpO2 95 %.     General: No apparent distress alert and oriented x3 mood and affect normal, dressed appropriately.  HEENT: Pupils equal, extraocular movements intact  Respiratory: Patient's speak in full sentences and does not appear short of breath  Cardiovascular: No lower extremity edema, non tender, no erythema  Skin: Warm dry intact with no signs of infection or rash on extremities or on axial skeleton.  Abdomen: Soft nontender  Neuro: Cranial nerves II through XII are intact, neurovascularly intact in all extremities with 2+ DTRs and 2+ pulses.  Lymph: No lymphadenopathy of posterior or anterior cervical chain or axillae bilaterally.  Gait normal with good balance and coordination.  MSK:  Non tender with full range of motion and good stability and symmetric strength and tone of shoulders, elbows, wrist, , knee and ankles bilaterally.  Right hip exam shows  the patient does have some tenderness to palpation of the greater trochanteric area but near full range of motion.  Mild positive FABER test on the right side only.  Left side is unremarkable at this time.   Procedure: Real-time Ultrasound Guided Injection of right greater trochanteric bursitis secondary to patient's body habitus Device: GE Logiq Q7 Ultrasound guided injection is preferred based studies that show increased duration, increased effect, greater accuracy, decreased procedural pain, increased response rate, and decreased cost with ultrasound guided versus blind injection.  Verbal informed consent obtained.  Time-out conducted.  Noted no overlying erythema, induration, or other signs of local infection.  Skin prepped in a sterile fashion.  Local anesthesia: Topical Ethyl chloride.  With sterile technique and under real time ultrasound guidance:  Greater trochanteric  area was visualized and patient's bursa was noted. A 22-gauge 3 inch needle was inserted and 4 cc of 0.5% Marcaine and 1 cc of Kenalog 40 mg/dL was injected. Pictures taken Completed without difficulty  Pain immediately resolved suggesting accurate placement of the medication.  Advised to call if fevers/chills, erythema, induration, drainage, or persistent bleeding.  Images permanently stored and available for review in the ultrasound unit.  Impression: Technically successful ultrasound guided injection.    Impression and Recommendations:     This case required medical decision making of moderate complexity. The above documentation has been reviewed and is accurate and complete Lyndal Pulley, DO       Note: This dictation was prepared with Dragon dictation along with smaller phrase technology. Any transcriptional errors that result from this process are unintentional.

## 2019-01-19 NOTE — Patient Instructions (Signed)
Start exercises 3 times a week Ice is good Send message in 2 weeks if no better PT Have an appointment set for 6-8 weeks

## 2019-01-19 NOTE — Assessment & Plan Note (Addendum)
Repeat injection given today.  Tolerated the procedure well.  Discussed icing regimen and home exercise, discussed which activities to do which wants to avoid.  Patient is to increase activity slowly.  Topical anti-inflammatory trial given.  Follow-up again in 6 to 8 weeks otherwise.  Worsening pain consider formal physical therapy.  X-rays pending Discussed with patient before injections that if doing injections too frequently can decrease the soft tissue on the side of the hip.  Patient elected to try the injection known during the coronavirus outbreak and would like to do it now to avoid more office visits if possible.

## 2019-01-29 DIAGNOSIS — C4441 Basal cell carcinoma of skin of scalp and neck: Secondary | ICD-10-CM | POA: Diagnosis not present

## 2019-01-29 DIAGNOSIS — L570 Actinic keratosis: Secondary | ICD-10-CM | POA: Diagnosis not present

## 2019-02-04 ENCOUNTER — Ambulatory Visit: Payer: PPO | Admitting: Family Medicine

## 2019-03-23 DIAGNOSIS — N951 Menopausal and female climacteric states: Secondary | ICD-10-CM | POA: Diagnosis not present

## 2019-03-29 DIAGNOSIS — N951 Menopausal and female climacteric states: Secondary | ICD-10-CM | POA: Diagnosis not present

## 2019-03-29 DIAGNOSIS — R4586 Emotional lability: Secondary | ICD-10-CM | POA: Diagnosis not present

## 2019-03-29 DIAGNOSIS — R6882 Decreased libido: Secondary | ICD-10-CM | POA: Diagnosis not present

## 2019-03-29 DIAGNOSIS — N898 Other specified noninflammatory disorders of vagina: Secondary | ICD-10-CM | POA: Diagnosis not present

## 2019-03-29 DIAGNOSIS — R232 Flushing: Secondary | ICD-10-CM | POA: Diagnosis not present

## 2019-03-31 DIAGNOSIS — L600 Ingrowing nail: Secondary | ICD-10-CM | POA: Diagnosis not present

## 2019-03-31 DIAGNOSIS — M79671 Pain in right foot: Secondary | ICD-10-CM | POA: Diagnosis not present

## 2019-03-31 DIAGNOSIS — M79672 Pain in left foot: Secondary | ICD-10-CM | POA: Diagnosis not present

## 2019-04-26 DIAGNOSIS — R232 Flushing: Secondary | ICD-10-CM | POA: Diagnosis not present

## 2019-04-26 DIAGNOSIS — R4586 Emotional lability: Secondary | ICD-10-CM | POA: Diagnosis not present

## 2019-04-26 DIAGNOSIS — R6882 Decreased libido: Secondary | ICD-10-CM | POA: Diagnosis not present

## 2019-04-26 DIAGNOSIS — N951 Menopausal and female climacteric states: Secondary | ICD-10-CM | POA: Diagnosis not present

## 2019-04-28 DIAGNOSIS — N898 Other specified noninflammatory disorders of vagina: Secondary | ICD-10-CM | POA: Diagnosis not present

## 2019-04-28 DIAGNOSIS — N951 Menopausal and female climacteric states: Secondary | ICD-10-CM | POA: Diagnosis not present

## 2019-04-28 DIAGNOSIS — R232 Flushing: Secondary | ICD-10-CM | POA: Diagnosis not present

## 2019-05-04 DIAGNOSIS — H2513 Age-related nuclear cataract, bilateral: Secondary | ICD-10-CM | POA: Diagnosis not present

## 2019-05-04 DIAGNOSIS — H524 Presbyopia: Secondary | ICD-10-CM | POA: Diagnosis not present

## 2019-06-17 DIAGNOSIS — Z800 Family history of malignant neoplasm of digestive organs: Secondary | ICD-10-CM | POA: Diagnosis not present

## 2019-06-17 DIAGNOSIS — Z86010 Personal history of colonic polyps: Secondary | ICD-10-CM | POA: Diagnosis not present

## 2019-06-17 DIAGNOSIS — K5904 Chronic idiopathic constipation: Secondary | ICD-10-CM | POA: Diagnosis not present

## 2019-06-17 DIAGNOSIS — Z1211 Encounter for screening for malignant neoplasm of colon: Secondary | ICD-10-CM | POA: Diagnosis not present

## 2019-06-21 DIAGNOSIS — L578 Other skin changes due to chronic exposure to nonionizing radiation: Secondary | ICD-10-CM | POA: Diagnosis not present

## 2019-06-21 DIAGNOSIS — L570 Actinic keratosis: Secondary | ICD-10-CM | POA: Diagnosis not present

## 2019-06-21 DIAGNOSIS — Z86018 Personal history of other benign neoplasm: Secondary | ICD-10-CM | POA: Diagnosis not present

## 2019-06-21 DIAGNOSIS — L300 Nummular dermatitis: Secondary | ICD-10-CM | POA: Diagnosis not present

## 2019-06-21 DIAGNOSIS — L738 Other specified follicular disorders: Secondary | ICD-10-CM | POA: Diagnosis not present

## 2019-06-21 DIAGNOSIS — Z85828 Personal history of other malignant neoplasm of skin: Secondary | ICD-10-CM | POA: Diagnosis not present

## 2019-06-21 DIAGNOSIS — L820 Inflamed seborrheic keratosis: Secondary | ICD-10-CM | POA: Diagnosis not present

## 2019-06-21 DIAGNOSIS — D225 Melanocytic nevi of trunk: Secondary | ICD-10-CM | POA: Diagnosis not present

## 2019-06-29 DIAGNOSIS — N951 Menopausal and female climacteric states: Secondary | ICD-10-CM | POA: Diagnosis not present

## 2019-06-29 DIAGNOSIS — N898 Other specified noninflammatory disorders of vagina: Secondary | ICD-10-CM | POA: Diagnosis not present

## 2019-06-29 DIAGNOSIS — R232 Flushing: Secondary | ICD-10-CM | POA: Diagnosis not present

## 2019-07-01 DIAGNOSIS — N898 Other specified noninflammatory disorders of vagina: Secondary | ICD-10-CM | POA: Diagnosis not present

## 2019-07-01 DIAGNOSIS — N951 Menopausal and female climacteric states: Secondary | ICD-10-CM | POA: Diagnosis not present

## 2019-07-01 DIAGNOSIS — R232 Flushing: Secondary | ICD-10-CM | POA: Diagnosis not present

## 2019-07-02 DIAGNOSIS — L570 Actinic keratosis: Secondary | ICD-10-CM | POA: Diagnosis not present

## 2019-07-06 DIAGNOSIS — L0100 Impetigo, unspecified: Secondary | ICD-10-CM | POA: Diagnosis not present

## 2019-08-03 ENCOUNTER — Other Ambulatory Visit: Payer: Self-pay

## 2019-08-04 DIAGNOSIS — K635 Polyp of colon: Secondary | ICD-10-CM | POA: Diagnosis not present

## 2019-08-04 DIAGNOSIS — Z1211 Encounter for screening for malignant neoplasm of colon: Secondary | ICD-10-CM | POA: Diagnosis not present

## 2019-08-04 DIAGNOSIS — Z86010 Personal history of colonic polyps: Secondary | ICD-10-CM | POA: Diagnosis not present

## 2019-08-04 DIAGNOSIS — D123 Benign neoplasm of transverse colon: Secondary | ICD-10-CM | POA: Diagnosis not present

## 2019-09-27 DIAGNOSIS — N898 Other specified noninflammatory disorders of vagina: Secondary | ICD-10-CM | POA: Diagnosis not present

## 2019-09-27 DIAGNOSIS — R232 Flushing: Secondary | ICD-10-CM | POA: Diagnosis not present

## 2019-09-27 DIAGNOSIS — N951 Menopausal and female climacteric states: Secondary | ICD-10-CM | POA: Diagnosis not present

## 2019-09-29 DIAGNOSIS — R232 Flushing: Secondary | ICD-10-CM | POA: Diagnosis not present

## 2019-09-29 DIAGNOSIS — R4586 Emotional lability: Secondary | ICD-10-CM | POA: Diagnosis not present

## 2019-09-29 DIAGNOSIS — N951 Menopausal and female climacteric states: Secondary | ICD-10-CM | POA: Diagnosis not present

## 2019-09-29 DIAGNOSIS — Z6823 Body mass index (BMI) 23.0-23.9, adult: Secondary | ICD-10-CM | POA: Diagnosis not present

## 2019-09-29 DIAGNOSIS — N898 Other specified noninflammatory disorders of vagina: Secondary | ICD-10-CM | POA: Diagnosis not present

## 2019-09-29 DIAGNOSIS — R6882 Decreased libido: Secondary | ICD-10-CM | POA: Diagnosis not present

## 2019-10-28 ENCOUNTER — Other Ambulatory Visit: Payer: Self-pay

## 2019-10-28 ENCOUNTER — Encounter: Payer: Self-pay | Admitting: Family Medicine

## 2019-10-28 ENCOUNTER — Ambulatory Visit: Payer: PPO | Admitting: Family Medicine

## 2019-10-28 ENCOUNTER — Ambulatory Visit (INDEPENDENT_AMBULATORY_CARE_PROVIDER_SITE_OTHER): Payer: PPO

## 2019-10-28 VITALS — BP 102/64 | HR 73 | Ht 64.0 in | Wt 143.0 lb

## 2019-10-28 DIAGNOSIS — M25559 Pain in unspecified hip: Secondary | ICD-10-CM | POA: Diagnosis not present

## 2019-10-28 DIAGNOSIS — M25551 Pain in right hip: Secondary | ICD-10-CM | POA: Diagnosis not present

## 2019-10-28 DIAGNOSIS — M7061 Trochanteric bursitis, right hip: Secondary | ICD-10-CM | POA: Diagnosis not present

## 2019-10-28 MED ORDER — GABAPENTIN 100 MG PO CAPS: 200.0000 mg | ORAL_CAPSULE | Freq: Every day | ORAL | 3 refills | Status: DC

## 2019-10-28 NOTE — Assessment & Plan Note (Signed)
Repeat injection given again today.  Tolerated the procedure well, discussed icing regimen and home exercise, discussed avoiding certain activities.  Patient has now had 3 of these injections in 1 year.  Concern for potential more of a lumbar radiculopathy and started on gabapentin.  Warned of potential side effects.  Patient is always responded well to this injection but if continuing to have trouble can consider advanced imaging.  Follow-up again 6 to 12 weeks

## 2019-10-28 NOTE — Progress Notes (Signed)
[facility] [place] Phone: [phone] Subjective:   [name], am serving as a scribe for Dr. Hulan Saas. This visit occurred during the SARS-CoV-2 public health emergency.  Safety protocols were in place, including screening questions prior to the visit, additional usage of staff PPE, and extensive cleaning of exam room while observing appropriate contact time as indicated for disinfecting solutions.   I'm seeing this patient by the request  of:  Leamon Arnt, MD  CC: Right hip pain  [facility]:[id number]   01/19/2019 Repeat injection given today.  Tolerated the procedure well.  Discussed icing regimen and home exercise, discussed which activities to do which wants to avoid.  Patient is to increase activity slowly.  Topical anti-inflammatory trial given.  Follow-up again in 6 to 8 weeks otherwise.  Worsening pain consider formal physical therapy.  X-rays pending Discussed with patient before injections that if doing injections too frequently can decrease the soft tissue on the side of the hip.  Patient elected to try the injection known during the coronavirus outbreak and would like to do it now to avoid more office visits if possible.  Update 10/28/2019 Terri Pena is a 64 y.o. female coming in with complaint of right hip pain. Hip pain was great after the Cortizone injections completely took away L hip pain never fully took away the R hip pain. R hip has been worse lately, is using a pillow under her R leg to help with the pain. This morning patient had the worst pain today in the R hip, sharp shooting pain that radiated to the knee. Pain is worse when laying and has no pain when walking.     Patient did have x-rays of the back done January 19, 2019.  The x-ray showed mild degenerative changes of the lumbar spine.  These were independently visualized by me.     Past Medical History:  Diagnosis Date   Family history  of colon cancer 12/06/2016   MGM - q 5 year colonoscopy with Dr. Collene Mares   Hyperlipidemia    LDL goal = < 160   Postmenopausal disorder    Thyroid nodule 5361   benign follicular nodule , Dr Margot Chimes   Past Surgical History:  Procedure Laterality Date   CESAREAN SECTION     X 2   COLONOSCOPY  2004 , 2010   negative X 2 , Dr Collene Mares   GANGLIONECTOMY     THYROID NODULE RESECTED  2005   1 LOBE REMOVED; benign follicular nodule   TONSILLECTOMY     Social History   Socioeconomic History   Marital status: Married    Spouse name: Sports administrator   Number of children: 2   Years of education: Not on file   Highest education level: Not on file  Occupational History   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  Tobacco Use   Smoking status: Never Smoker   Smokeless tobacco: Never Used  Vaping Use   Vaping Use: Never used  Substance and Sexual Activity   Alcohol use: Yes    Comment: rare   Drug use: No   Sexual activity: Not on file  Other Topics Concern   Not on file  Social History Narrative   Decreased fried foods & sweets   Regular exercise in warmer weather      Social Determinants of Health   Financial Resource Strain:    Difficulty of Paying Living Expenses: Not on file  Food Insecurity:    Worried About Charity fundraiser in the Last Year: Not on file   [employer] of Food in the Last Year: Not on file  Transportation Needs:    Lack of Transportation (Medical): Not on file   Lack of Transportation (Non-Medical): Not on file  Physical Activity:    Days of Exercise per Week: Not on file   Minutes of Exercise per Session: Not on file  Stress:    Feeling of Stress : Not on file  Social Connections:    Frequency of Communication with Friends and Family: Not on file   Frequency of Social Gatherings with Friends and Family: Not on file   Attends Religious Services: Not on file   Active Member of Clubs or Organizations: Not on file   Attends Theatre manager Meetings: Not on file   Marital Status: Not on file   Allergies  Allergen Reactions   Sulfonamide Derivatives Itching and Rash    Rash & itching Because of a history of documented adverse serious drug reaction;Medi Alert bracelet  is recommended   Family History  Problem Relation Age of Onset   ALS Mother    Hyperlipidemia Mother    Alcohol abuse Father    Hyperlipidemia Father    Acromegaly Brother    Prostate cancer Maternal Uncle        X 3   Colon cancer Maternal Grandmother    Heart attack Maternal Grandmother 80   Prostate cancer Maternal Grandfather    Heart attack Maternal Grandfather 82   Diabetes Neg Hx    Stroke Neg Hx    Breast cancer Neg Hx      Current Outpatient Medications (Cardiovascular):    pravastatin (PRAVACHOL) 40 MG tablet, Take 1 tablet (40 mg total) by mouth at bedtime. (Patient not taking: Reported on 10/28/2019)   Current Outpatient Medications (Analgesics):    ibuprofen (ADVIL,MOTRIN) 200 MG tablet, Take 800 mg by mouth every 6 (six) hours as needed for moderate pain. (Patient not taking: Reported on 10/28/2019)   Current Outpatient Medications (Other):    gabapentin (NEURONTIN) 100 MG capsule, Take 2 capsules (200 mg total) by mouth at bedtime.   PARoxetine (PAXIL) 10 MG tablet, Take 1 tablet (10 mg total) by mouth every morning. (Patient not taking: Reported on 10/28/2019)   Reviewed prior external information including notes and imaging from  primary care provider As well as notes that were available from care everywhere and other healthcare systems.  Past medical history, social, surgical and family history all reviewed in electronic medical record.  No pertanent information unless stated regarding to the chief complaint.   Review of Systems:  No headache, visual changes, nausea, vomiting, diarrhea, constipation, dizziness, abdominal pain, skin rash, fevers, chills, night sweats, weight loss, swollen lymph  nodes, body aches, joint swelling, chest pain, shortness of breath, mood changes. POSITIVE muscle aches  Objective  Blood pressure 102/64, pulse 73, height 5\' 4"  (1.626 m), weight 143 lb (64.9 kg), SpO2 97 %.   General: No apparent distress alert and oriented x3 mood and affect normal, dressed appropriately.  HEENT: Pupils equal, extraocular movements intact  Respiratory: Patient's speak in full sentences and does not appear short of breath  Cardiovascular: No lower extremity edema, non tender, no erythema  Gait normal with good balance and coordination.  MSK:  Non tender with full range of motion and good stability and symmetric strength and tone of shoulders, elbows, wrist, , knee and ankles  bilaterally.  Low back exam does have some mild loss of lordosis.  Some tightness noted with Corky Sox on the right greater than left.  Severe tenderness to palpation on the right.  Patient does have fullness of the musculature on the right but bilateral  After verbal consent patient was prepped with alcohol swabs and with a 21-gauge 2 inch needle injected into the right greater trochanteric area with a total of 1 cc of 0.5% Marcaine and 1 cc of Kenalog 40 mg/mL.  Near complete resolution of pain immediately.  No blood loss.  Postinjection instructions given after Band-Aid placed   Impression and Recommendations:     The above documentation has been reviewed and is accurate and complete Lyndal Pulley, DO

## 2019-10-28 NOTE — Patient Instructions (Signed)
Gabapentin 200mg  at night, if drowsy in AM go to 100mg   Ice 20 minutes 2 times daily. Usually after activity and before bed. Exercises 3 times a week.  Bike or walk for cardio  See me again in 12  weeks and if still trouble we can do MRI of the back

## 2019-12-07 DIAGNOSIS — Z86018 Personal history of other benign neoplasm: Secondary | ICD-10-CM | POA: Diagnosis not present

## 2019-12-07 DIAGNOSIS — L249 Irritant contact dermatitis, unspecified cause: Secondary | ICD-10-CM | POA: Diagnosis not present

## 2019-12-07 DIAGNOSIS — L570 Actinic keratosis: Secondary | ICD-10-CM | POA: Diagnosis not present

## 2019-12-07 DIAGNOSIS — L719 Rosacea, unspecified: Secondary | ICD-10-CM | POA: Diagnosis not present

## 2019-12-07 DIAGNOSIS — L578 Other skin changes due to chronic exposure to nonionizing radiation: Secondary | ICD-10-CM | POA: Diagnosis not present

## 2019-12-07 DIAGNOSIS — Z85828 Personal history of other malignant neoplasm of skin: Secondary | ICD-10-CM | POA: Diagnosis not present

## 2019-12-07 DIAGNOSIS — D225 Melanocytic nevi of trunk: Secondary | ICD-10-CM | POA: Diagnosis not present

## 2019-12-07 DIAGNOSIS — L738 Other specified follicular disorders: Secondary | ICD-10-CM | POA: Diagnosis not present

## 2019-12-07 DIAGNOSIS — D2372 Other benign neoplasm of skin of left lower limb, including hip: Secondary | ICD-10-CM | POA: Diagnosis not present

## 2019-12-07 DIAGNOSIS — L821 Other seborrheic keratosis: Secondary | ICD-10-CM | POA: Diagnosis not present

## 2019-12-07 DIAGNOSIS — D2271 Melanocytic nevi of right lower limb, including hip: Secondary | ICD-10-CM | POA: Diagnosis not present

## 2019-12-28 DIAGNOSIS — N951 Menopausal and female climacteric states: Secondary | ICD-10-CM | POA: Diagnosis not present

## 2019-12-30 DIAGNOSIS — R232 Flushing: Secondary | ICD-10-CM | POA: Diagnosis not present

## 2019-12-30 DIAGNOSIS — R4586 Emotional lability: Secondary | ICD-10-CM | POA: Diagnosis not present

## 2019-12-30 DIAGNOSIS — R6882 Decreased libido: Secondary | ICD-10-CM | POA: Diagnosis not present

## 2019-12-30 DIAGNOSIS — Z6823 Body mass index (BMI) 23.0-23.9, adult: Secondary | ICD-10-CM | POA: Diagnosis not present

## 2019-12-30 DIAGNOSIS — N898 Other specified noninflammatory disorders of vagina: Secondary | ICD-10-CM | POA: Diagnosis not present

## 2019-12-30 DIAGNOSIS — N951 Menopausal and female climacteric states: Secondary | ICD-10-CM | POA: Diagnosis not present

## 2020-01-20 DIAGNOSIS — Z124 Encounter for screening for malignant neoplasm of cervix: Secondary | ICD-10-CM | POA: Diagnosis not present

## 2020-01-20 DIAGNOSIS — Z6823 Body mass index (BMI) 23.0-23.9, adult: Secondary | ICD-10-CM | POA: Diagnosis not present

## 2020-01-27 ENCOUNTER — Ambulatory Visit: Payer: PPO | Admitting: Family Medicine

## 2020-02-09 DIAGNOSIS — M5432 Sciatica, left side: Secondary | ICD-10-CM | POA: Diagnosis not present

## 2020-02-09 DIAGNOSIS — M9903 Segmental and somatic dysfunction of lumbar region: Secondary | ICD-10-CM | POA: Diagnosis not present

## 2020-02-21 DIAGNOSIS — N958 Other specified menopausal and perimenopausal disorders: Secondary | ICD-10-CM | POA: Diagnosis not present

## 2020-02-21 DIAGNOSIS — Z130 Encounter for screening for diseases of the blood and blood-forming organs and certain disorders involving the immune mechanism: Secondary | ICD-10-CM | POA: Diagnosis not present

## 2020-02-21 DIAGNOSIS — Z1329 Encounter for screening for other suspected endocrine disorder: Secondary | ICD-10-CM | POA: Diagnosis not present

## 2020-02-21 DIAGNOSIS — Z1321 Encounter for screening for nutritional disorder: Secondary | ICD-10-CM | POA: Diagnosis not present

## 2020-02-21 DIAGNOSIS — Z1231 Encounter for screening mammogram for malignant neoplasm of breast: Secondary | ICD-10-CM | POA: Diagnosis not present

## 2020-02-21 DIAGNOSIS — E785 Hyperlipidemia, unspecified: Secondary | ICD-10-CM | POA: Diagnosis not present

## 2020-02-21 DIAGNOSIS — M8588 Other specified disorders of bone density and structure, other site: Secondary | ICD-10-CM | POA: Diagnosis not present

## 2020-02-21 DIAGNOSIS — Z13228 Encounter for screening for other metabolic disorders: Secondary | ICD-10-CM | POA: Diagnosis not present

## 2020-05-10 DIAGNOSIS — H2513 Age-related nuclear cataract, bilateral: Secondary | ICD-10-CM | POA: Diagnosis not present

## 2020-05-10 DIAGNOSIS — H5203 Hypermetropia, bilateral: Secondary | ICD-10-CM | POA: Diagnosis not present

## 2020-05-10 DIAGNOSIS — D3131 Benign neoplasm of right choroid: Secondary | ICD-10-CM | POA: Diagnosis not present

## 2020-05-31 DIAGNOSIS — N951 Menopausal and female climacteric states: Secondary | ICD-10-CM | POA: Diagnosis not present

## 2020-06-02 DIAGNOSIS — Z86018 Personal history of other benign neoplasm: Secondary | ICD-10-CM | POA: Diagnosis not present

## 2020-06-02 DIAGNOSIS — D485 Neoplasm of uncertain behavior of skin: Secondary | ICD-10-CM | POA: Diagnosis not present

## 2020-06-02 DIAGNOSIS — L719 Rosacea, unspecified: Secondary | ICD-10-CM | POA: Diagnosis not present

## 2020-06-02 DIAGNOSIS — D2271 Melanocytic nevi of right lower limb, including hip: Secondary | ICD-10-CM | POA: Diagnosis not present

## 2020-06-02 DIAGNOSIS — L578 Other skin changes due to chronic exposure to nonionizing radiation: Secondary | ICD-10-CM | POA: Diagnosis not present

## 2020-06-02 DIAGNOSIS — Z85828 Personal history of other malignant neoplasm of skin: Secondary | ICD-10-CM | POA: Diagnosis not present

## 2020-06-02 DIAGNOSIS — L820 Inflamed seborrheic keratosis: Secondary | ICD-10-CM | POA: Diagnosis not present

## 2020-06-02 DIAGNOSIS — C44511 Basal cell carcinoma of skin of breast: Secondary | ICD-10-CM | POA: Diagnosis not present

## 2020-06-02 DIAGNOSIS — L821 Other seborrheic keratosis: Secondary | ICD-10-CM | POA: Diagnosis not present

## 2020-06-06 DIAGNOSIS — N951 Menopausal and female climacteric states: Secondary | ICD-10-CM | POA: Diagnosis not present

## 2020-06-06 DIAGNOSIS — R232 Flushing: Secondary | ICD-10-CM | POA: Diagnosis not present

## 2020-06-06 DIAGNOSIS — Z6823 Body mass index (BMI) 23.0-23.9, adult: Secondary | ICD-10-CM | POA: Diagnosis not present

## 2020-06-06 DIAGNOSIS — N898 Other specified noninflammatory disorders of vagina: Secondary | ICD-10-CM | POA: Diagnosis not present

## 2020-07-25 DIAGNOSIS — C44511 Basal cell carcinoma of skin of breast: Secondary | ICD-10-CM | POA: Diagnosis not present

## 2020-07-25 DIAGNOSIS — L905 Scar conditions and fibrosis of skin: Secondary | ICD-10-CM | POA: Diagnosis not present

## 2020-09-13 DIAGNOSIS — L820 Inflamed seborrheic keratosis: Secondary | ICD-10-CM | POA: Diagnosis not present

## 2020-09-18 DIAGNOSIS — N951 Menopausal and female climacteric states: Secondary | ICD-10-CM | POA: Diagnosis not present

## 2020-09-25 DIAGNOSIS — R6882 Decreased libido: Secondary | ICD-10-CM | POA: Diagnosis not present

## 2020-09-25 DIAGNOSIS — R232 Flushing: Secondary | ICD-10-CM | POA: Diagnosis not present

## 2020-09-25 DIAGNOSIS — N951 Menopausal and female climacteric states: Secondary | ICD-10-CM | POA: Diagnosis not present

## 2020-09-25 DIAGNOSIS — Z6823 Body mass index (BMI) 23.0-23.9, adult: Secondary | ICD-10-CM | POA: Diagnosis not present

## 2021-01-09 DIAGNOSIS — D485 Neoplasm of uncertain behavior of skin: Secondary | ICD-10-CM | POA: Diagnosis not present

## 2021-01-09 DIAGNOSIS — Z86018 Personal history of other benign neoplasm: Secondary | ICD-10-CM | POA: Diagnosis not present

## 2021-01-09 DIAGNOSIS — C44519 Basal cell carcinoma of skin of other part of trunk: Secondary | ICD-10-CM | POA: Diagnosis not present

## 2021-01-09 DIAGNOSIS — D2271 Melanocytic nevi of right lower limb, including hip: Secondary | ICD-10-CM | POA: Diagnosis not present

## 2021-01-09 DIAGNOSIS — L578 Other skin changes due to chronic exposure to nonionizing radiation: Secondary | ICD-10-CM | POA: Diagnosis not present

## 2021-01-09 DIAGNOSIS — L700 Acne vulgaris: Secondary | ICD-10-CM | POA: Diagnosis not present

## 2021-01-09 DIAGNOSIS — L821 Other seborrheic keratosis: Secondary | ICD-10-CM | POA: Diagnosis not present

## 2021-01-09 DIAGNOSIS — Z85828 Personal history of other malignant neoplasm of skin: Secondary | ICD-10-CM | POA: Diagnosis not present

## 2021-01-09 DIAGNOSIS — L570 Actinic keratosis: Secondary | ICD-10-CM | POA: Diagnosis not present

## 2021-02-12 DIAGNOSIS — N951 Menopausal and female climacteric states: Secondary | ICD-10-CM | POA: Diagnosis not present

## 2021-02-14 DIAGNOSIS — N951 Menopausal and female climacteric states: Secondary | ICD-10-CM | POA: Diagnosis not present

## 2021-02-14 DIAGNOSIS — Z6823 Body mass index (BMI) 23.0-23.9, adult: Secondary | ICD-10-CM | POA: Diagnosis not present

## 2021-02-14 DIAGNOSIS — R232 Flushing: Secondary | ICD-10-CM | POA: Diagnosis not present

## 2021-02-14 DIAGNOSIS — N898 Other specified noninflammatory disorders of vagina: Secondary | ICD-10-CM | POA: Diagnosis not present

## 2021-02-28 DIAGNOSIS — M8580 Other specified disorders of bone density and structure, unspecified site: Secondary | ICD-10-CM | POA: Diagnosis not present

## 2021-02-28 DIAGNOSIS — E559 Vitamin D deficiency, unspecified: Secondary | ICD-10-CM | POA: Diagnosis not present

## 2021-02-28 DIAGNOSIS — E890 Postprocedural hypothyroidism: Secondary | ICD-10-CM | POA: Diagnosis not present

## 2021-02-28 DIAGNOSIS — Z7689 Persons encountering health services in other specified circumstances: Secondary | ICD-10-CM | POA: Diagnosis not present

## 2021-02-28 DIAGNOSIS — E785 Hyperlipidemia, unspecified: Secondary | ICD-10-CM | POA: Diagnosis not present

## 2021-02-28 DIAGNOSIS — N951 Menopausal and female climacteric states: Secondary | ICD-10-CM | POA: Diagnosis not present

## 2021-02-28 DIAGNOSIS — R7989 Other specified abnormal findings of blood chemistry: Secondary | ICD-10-CM | POA: Diagnosis not present

## 2021-02-28 DIAGNOSIS — Z0000 Encounter for general adult medical examination without abnormal findings: Secondary | ICD-10-CM | POA: Diagnosis not present

## 2021-03-22 DIAGNOSIS — Z136 Encounter for screening for cardiovascular disorders: Secondary | ICD-10-CM | POA: Diagnosis not present

## 2021-03-22 DIAGNOSIS — E785 Hyperlipidemia, unspecified: Secondary | ICD-10-CM | POA: Diagnosis not present

## 2021-03-22 DIAGNOSIS — I2510 Atherosclerotic heart disease of native coronary artery without angina pectoris: Secondary | ICD-10-CM | POA: Diagnosis not present

## 2021-04-10 DIAGNOSIS — K769 Liver disease, unspecified: Secondary | ICD-10-CM | POA: Diagnosis not present

## 2021-04-10 DIAGNOSIS — K7689 Other specified diseases of liver: Secondary | ICD-10-CM | POA: Diagnosis not present

## 2021-04-10 DIAGNOSIS — K8020 Calculus of gallbladder without cholecystitis without obstruction: Secondary | ICD-10-CM | POA: Diagnosis not present

## 2021-04-20 DIAGNOSIS — K769 Liver disease, unspecified: Secondary | ICD-10-CM | POA: Diagnosis not present

## 2021-04-24 DIAGNOSIS — K8080 Other cholelithiasis without obstruction: Secondary | ICD-10-CM | POA: Diagnosis not present

## 2021-04-24 DIAGNOSIS — K5900 Constipation, unspecified: Secondary | ICD-10-CM | POA: Diagnosis not present

## 2021-04-24 DIAGNOSIS — R933 Abnormal findings on diagnostic imaging of other parts of digestive tract: Secondary | ICD-10-CM | POA: Diagnosis not present

## 2021-05-14 DIAGNOSIS — N951 Menopausal and female climacteric states: Secondary | ICD-10-CM | POA: Diagnosis not present

## 2021-05-16 DIAGNOSIS — Z6824 Body mass index (BMI) 24.0-24.9, adult: Secondary | ICD-10-CM | POA: Diagnosis not present

## 2021-05-16 DIAGNOSIS — H52203 Unspecified astigmatism, bilateral: Secondary | ICD-10-CM | POA: Diagnosis not present

## 2021-05-16 DIAGNOSIS — D3131 Benign neoplasm of right choroid: Secondary | ICD-10-CM | POA: Diagnosis not present

## 2021-05-16 DIAGNOSIS — N951 Menopausal and female climacteric states: Secondary | ICD-10-CM | POA: Diagnosis not present

## 2021-05-16 DIAGNOSIS — R6882 Decreased libido: Secondary | ICD-10-CM | POA: Diagnosis not present

## 2021-05-16 DIAGNOSIS — N898 Other specified noninflammatory disorders of vagina: Secondary | ICD-10-CM | POA: Diagnosis not present

## 2021-05-16 DIAGNOSIS — H2513 Age-related nuclear cataract, bilateral: Secondary | ICD-10-CM | POA: Diagnosis not present

## 2021-06-14 DIAGNOSIS — Z1231 Encounter for screening mammogram for malignant neoplasm of breast: Secondary | ICD-10-CM | POA: Diagnosis not present

## 2021-06-14 DIAGNOSIS — Z6823 Body mass index (BMI) 23.0-23.9, adult: Secondary | ICD-10-CM | POA: Diagnosis not present

## 2021-06-14 DIAGNOSIS — Z01419 Encounter for gynecological examination (general) (routine) without abnormal findings: Secondary | ICD-10-CM | POA: Diagnosis not present

## 2021-07-05 DIAGNOSIS — L821 Other seborrheic keratosis: Secondary | ICD-10-CM | POA: Diagnosis not present

## 2021-07-05 DIAGNOSIS — M5432 Sciatica, left side: Secondary | ICD-10-CM | POA: Diagnosis not present

## 2021-07-05 DIAGNOSIS — Z86018 Personal history of other benign neoplasm: Secondary | ICD-10-CM | POA: Diagnosis not present

## 2021-07-05 DIAGNOSIS — D2271 Melanocytic nevi of right lower limb, including hip: Secondary | ICD-10-CM | POA: Diagnosis not present

## 2021-07-05 DIAGNOSIS — L719 Rosacea, unspecified: Secondary | ICD-10-CM | POA: Diagnosis not present

## 2021-07-05 DIAGNOSIS — Z85828 Personal history of other malignant neoplasm of skin: Secondary | ICD-10-CM | POA: Diagnosis not present

## 2021-07-05 DIAGNOSIS — L111 Transient acantholytic dermatosis [Grover]: Secondary | ICD-10-CM | POA: Diagnosis not present

## 2021-07-05 DIAGNOSIS — M9903 Segmental and somatic dysfunction of lumbar region: Secondary | ICD-10-CM | POA: Diagnosis not present

## 2021-07-05 DIAGNOSIS — L570 Actinic keratosis: Secondary | ICD-10-CM | POA: Diagnosis not present

## 2021-07-05 DIAGNOSIS — L820 Inflamed seborrheic keratosis: Secondary | ICD-10-CM | POA: Diagnosis not present

## 2021-07-05 DIAGNOSIS — L578 Other skin changes due to chronic exposure to nonionizing radiation: Secondary | ICD-10-CM | POA: Diagnosis not present

## 2021-07-05 DIAGNOSIS — D2372 Other benign neoplasm of skin of left lower limb, including hip: Secondary | ICD-10-CM | POA: Diagnosis not present

## 2021-07-05 DIAGNOSIS — D225 Melanocytic nevi of trunk: Secondary | ICD-10-CM | POA: Diagnosis not present

## 2021-07-19 DIAGNOSIS — M9903 Segmental and somatic dysfunction of lumbar region: Secondary | ICD-10-CM | POA: Diagnosis not present

## 2021-07-19 DIAGNOSIS — M5432 Sciatica, left side: Secondary | ICD-10-CM | POA: Diagnosis not present

## 2021-10-01 ENCOUNTER — Encounter: Payer: Self-pay | Admitting: *Deleted

## 2022-01-04 DIAGNOSIS — R051 Acute cough: Secondary | ICD-10-CM | POA: Diagnosis not present

## 2022-05-29 IMAGING — DX DG PELVIS 1-2V
1 series · 1 of 1 positions shown · non-contrast
Comparison: None.

CLINICAL DATA: Hip pain

EXAM:
PELVIS - 1-2 VIEW

[pelvis ap]
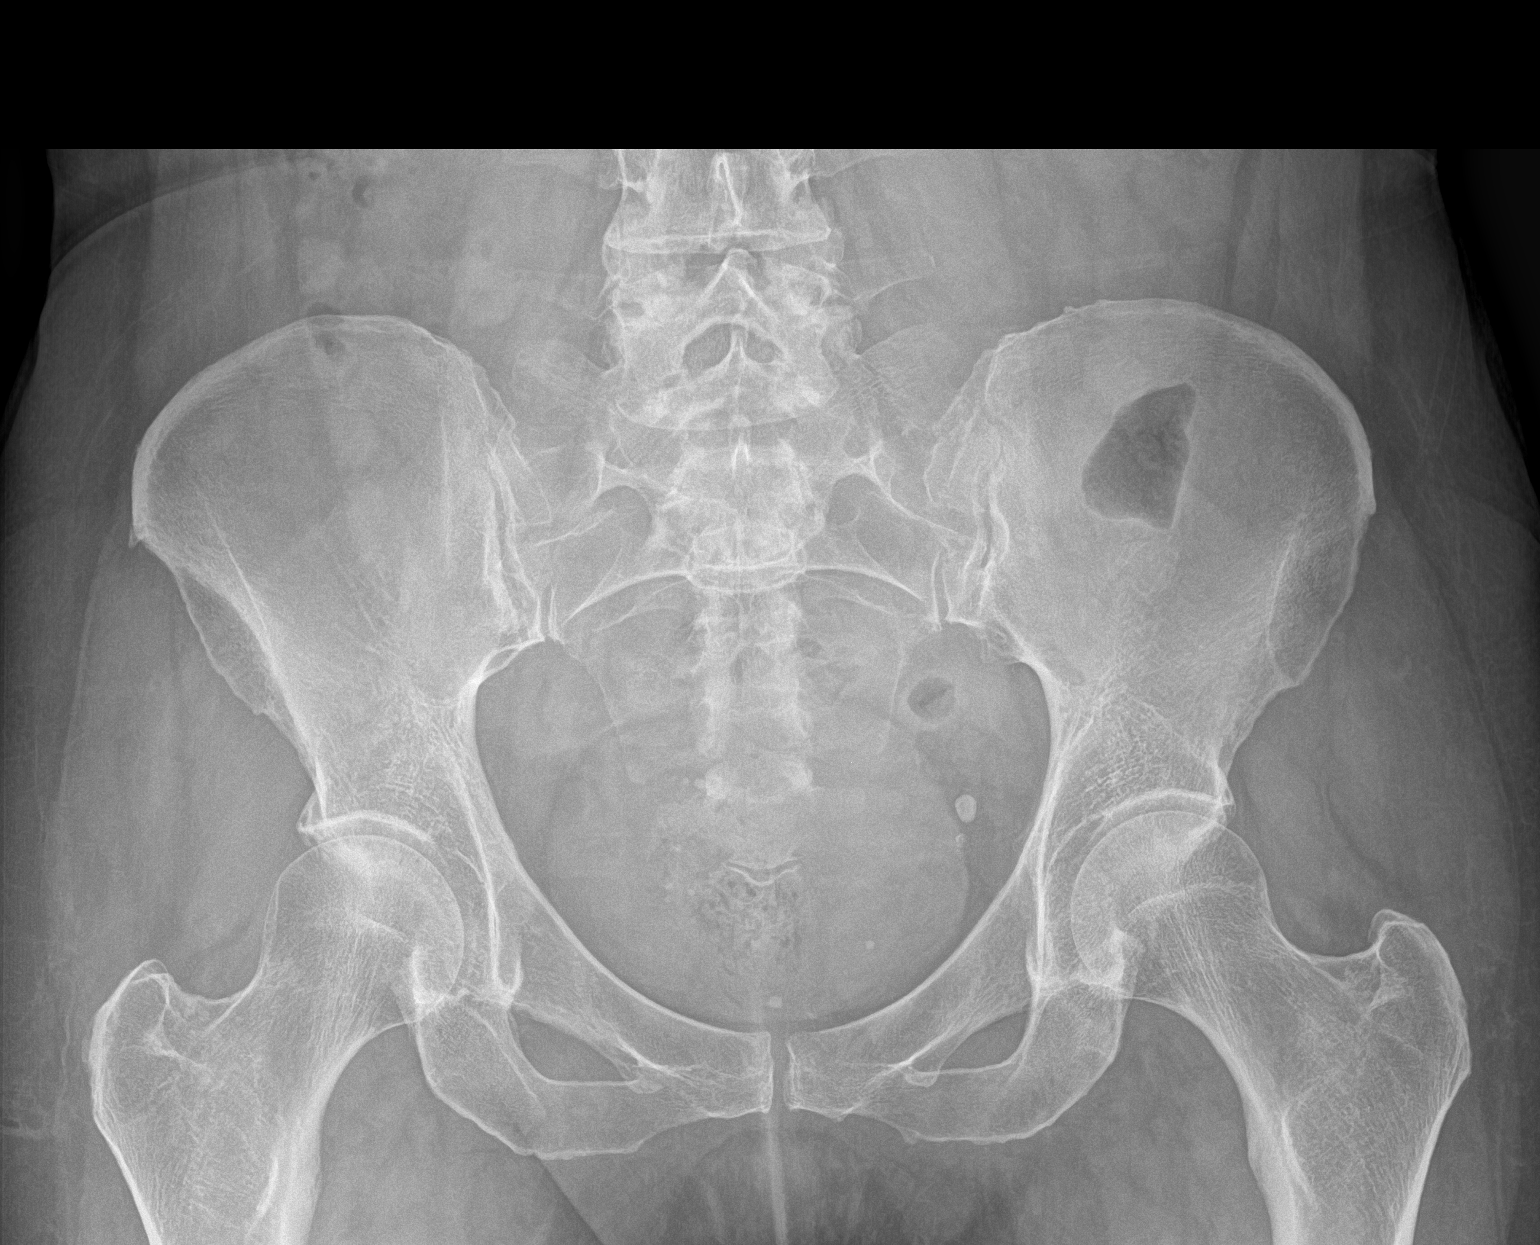

[1 of 1 positions shown; findings below may reference images not displayed]

FINDINGS: There is no evidence of pelvic fracture or diastasis. No pelvic bone
lesions are seen. Hip joints and SI joints are symmetric and
unremarkable.
IMPRESSION: Negative.

## 2022-06-26 ENCOUNTER — Telehealth: Payer: Self-pay

## 2022-06-26 NOTE — Telephone Encounter (Signed)
Received a referral for primary care from [facility] Dr Richardean Chimera. Pt states she wants to wait until Dr Durene Cal starts accepting new pts

## 2023-01-28 ENCOUNTER — Encounter: Payer: Self-pay | Admitting: Internal Medicine

## 2023-01-28 ENCOUNTER — Ambulatory Visit: Payer: PPO | Admitting: Internal Medicine

## 2023-01-28 VITALS — BP 106/70 | HR 84 | Temp 97.6°F | Ht 65.0 in | Wt 144.6 lb

## 2023-01-28 DIAGNOSIS — K219 Gastro-esophageal reflux disease without esophagitis: Secondary | ICD-10-CM

## 2023-01-28 DIAGNOSIS — R918 Other nonspecific abnormal finding of lung field: Secondary | ICD-10-CM

## 2023-01-28 NOTE — Patient Instructions (Signed)
It was a pleasure to see you today!  Please schedule follow up scheduled with myself in 2 months.  If my schedule is not open yet, we will contact you with a reminder closer to that time. Please call [phone] if you haven't heard from [place] a month before, and always call us sooner if issues or concerns arise. You can also send [place] a message through MyChart, but but aware that this is not to be used for urgent issues and it may take up to 5-7 days to receive a reply. Please be aware that you will likely be able to view your results before I have a chance to respond to them. Please give [place] 5 business days to respond to any non-urgent results.    I think your symptoms of chronic cough are most likely related to the reflux. Please continue taking the acid reflux medication. Your nodules are most likely to be benign or not cancerous in appearance. Please make a CD of the images and bring to our office so I can review.   What is GERD? Gastroesophageal reflux disease (GERD) is gastroesophageal reflux diseasewhich occurs when the lower esophageal sphincter (LES) opens spontaneously, for varying periods of time, or does not close properly and stomach contents rise up into the esophagus. GER is also called acid reflux or acid regurgitation, because digestive juices--called acids--rise up with the food. The esophagus is the tube that carries food from the mouth to the stomach. The LES is a ring of muscle at the bottom of the esophagus that acts like a valve between the esophagus and stomach.  When acid reflux occurs, food or fluid can be tasted in the back of the mouth. When refluxed stomach acid touches the lining of the esophagus it may cause a burning sensation in the chest or throat called heartburn or acid indigestion. Occasional reflux is common. Persistent reflux that occurs more than twice a week is considered GERD, and it can eventually lead to more serious health problems. People of all ages can have  GERD. Studies have shown that GERD may worsen or contribute to asthma, chronic cough, and pulmonary fibrosis.   What are the symptoms of GERD? The main symptom of GERD in adults is frequent heartburn, also called acid indigestion--burning-type pain in the lower part of the mid-chest, behind the breast bone, and in the mid-abdomen.  Not all reflux is acidic in nature, and many patients don't have heart burn at all. Sometimes it feels like a cough (either dry or with mucus), choking sensation, asthma, shortness of breath, waking up at night, frequent throat clearing, or trouble swallowing.    What causes GERD? The reason some people develop GERD is still unclear. However, research shows that in people with GERD, the LES relaxes while the rest of the esophagus is working. Anatomical abnormalities such as a hiatal hernia may also contribute to GERD. A hiatal hernia occurs when the upper part of the stomach and the LES move above the diaphragm, the muscle wall that separates the stomach from the chest. Normally, the diaphragm helps the LES keep acid from rising up into the esophagus. When a hiatal hernia is present, acid reflux can occur more easily. A hiatal hernia can occur in people of any age and is most often a normal finding in otherwise healthy people over age 71. Most of the time, a hiatal hernia produces no symptoms.   Other factors that may contribute to GERD include - Obesity or recent weight  gain - Pregnancy  - Smoking  - Diet - Certain medications  Common foods that can worsen reflux symptoms include: - carbonated beverages - artificial sweeteners - citrus fruits  - chocolate  - drinks with caffeine or alcohol  - fatty and fried foods  - garlic and onions  - mint flavorings  - spicy foods  - tomato-based foods, like spaghetti sauce, salsa, chili, and pizza   Lifestyle Changes If you smoke, stop.  Avoid foods and beverages that worsen symptoms (see above.) Lose weight if  needed.  Eat small, frequent meals.  Wear loose-fitting clothes.  Avoid lying down for 3 hours after a meal.  Raise the head of your bed 6 to 8 inches by securing wood blocks under the bedposts. Just using extra pillows will not help, but using a wedge-shaped pillow may be helpful.  Medications  H2 blockers, such as cimetidine (Tagamet HB), famotidine (Pepcid AC), nizatidine (Axid AR), and ranitidine (Zantac 75), decrease acid production. They are available in prescription strength and over-the-counter strength. These drugs provide short-term relief and are effective for about half of those who have GERD symptoms.  Proton pump inhibitors include omeprazole (Prilosec, Zegerid), lansoprazole (Prevacid), pantoprazole (Protonix), rabeprazole (Aciphex), and esomeprazole (Nexium), which are available by prescription. Prilosec is also available in over-the-counter strength. Proton pump inhibitors are more effective than H2 blockers and can relieve symptoms and heal the esophageal lining in almost everyone who has GERD.  Because drugs work in different ways, combinations of medications may help control symptoms. People who get heartburn after eating may take both antacids and H2 blockers. The antacids work first to neutralize the acid in the stomach, and then the H2 blockers act on acid production. By the time the antacid stops working, the H2 blocker will have stopped acid production. Your health care provider is the best source of information about how to use medications for GERD.   Points to Remember 1. You can have GERD without having heartburn. Your symptoms could include a dry cough, asthma symptoms, or trouble swallowing.  2. Taking medications daily as prescribed is important in controlling you symptoms.  Sometimes it can take up to 8 weeks to fully achieve the effects of the medications prescribed.  3. Coughing related to GERD can be difficult to treat and is very frustrating!  However, it is  important to stick with these medications and lifestyle modifications before pursuing more aggressive or invasive test and treatments.

## 2023-01-28 NOTE — Progress Notes (Signed)
Terri Pena    [id number]    1955-10-16  Primary Care Physician:Lazoff, Jovita Gamma, DO  Referring Physician: No referring provider defined for this encounter. Reason for Consultation: pulmonary nodule Date of Consultation: 01/28/2023  Chief complaint:   Chief Complaint  Patient presents with   Consult    Has 5-6 nodules.  She started coughing in the spring.  PCP look at CT scan again and said she had esophagitis and gave her a reflux pill to take and it is some better.  The cough is worst in the am.  Mucous is light green.     HPI: Terri Pena is a 68 y.o. woman who presents for new patient evaluation for cough and pulmonary nodules. Chronic cough of almost 1 year duration.  She started taking her reflux medication 3 weeks ago and the cough is a lot better.  Cough is usually first thing in the morning. Does not wake her up at night. No worsening with food/after eating. Dry with occasional phlegm. Deep inhalation does worsen the cough. No change in quality of voice.she did have bad heart burn when all of this started but nothing in the last 3 months.   Denies seasonal allergies, sinus pressure, nasal drainage.  No dyspnea, chest tightness wheezing.   No recurrent pneumonia/bronchitis. No childhood respiratory disease.   Social history:  Occupation: self-employed, Customer service manager.  Exposures: lives at home with husband, no pets Smoking history: passive smoke exposure in mother (husband also for 1 year) never smoker  Social History   Occupational History   Occupation: Real Teacher, early years/pre  Tobacco Use   Smoking status: Never   Smokeless tobacco: Never  Vaping Use   Vaping status: Never Used  Substance and Sexual Activity   Alcohol use: Yes    Comment: rare   Drug use: No   Sexual activity: Not on file    Relevant family history:  Family History  Problem Relation Age of Onset   ALS Mother    Hyperlipidemia Mother    Alcohol abuse Father     Hyperlipidemia Father    Acromegaly Brother    Colon cancer Maternal Grandmother    Heart attack Maternal Grandmother 78   Prostate cancer Maternal Grandfather    Heart attack Maternal Grandfather 82   Prostate cancer Maternal Uncle        X 3   Diabetes Neg Hx    Stroke Neg Hx    Breast cancer Neg Hx     Past Medical History:  Diagnosis Date   Family history of colon cancer 12/06/2016   MGM - q 5 year colonoscopy with Dr. Loreta Ave   Hyperlipidemia    LDL goal = < 160   Postmenopausal disorder    Thyroid nodule 2005   benign follicular nodule , Dr Jamey Ripa    Past Surgical History:  Procedure Laterality Date   CESAREAN SECTION     X 2   COLONOSCOPY  2004 , 2010   negative X 2 , Dr Loreta Ave   GANGLIONECTOMY     THYROID NODULE RESECTED  2005   1 LOBE REMOVED; benign follicular nodule   TONSILLECTOMY       Physical Exam: Blood pressure 106/70, pulse 84, temperature 97.6 F (36.4 C), temperature source Oral, height 5\' 5"  (1.651 m), weight 144 lb 9.6 oz (65.6 kg), SpO2 97%. Gen:      No acute distress ENT:  no nasal polyps, mucus  membranes moist Lungs:    No increased respiratory effort, symmetric chest wall excursion, clear to auscultation bilaterally, no wheezes or crackles CV:         Regular rate and rhythm; no murmurs, rubs, or gallops.  No pedal edema Abd:      + bowel sounds; soft, non-tender; no distension MSK: no acute synovitis of DIP or PIP joints, no mechanics hands.  Skin:      Warm and dry; no rashes Neuro: normal speech, no focal facial asymmetry Psych: alert and oriented x3, normal mood and affect   Data Reviewed/Medical Decision Making:  Independent interpretation of tests: Imaging:  PFTs:  Labs:  Lab Results  Component Value Date   WBC 3.9 (L) 12/06/2016   HGB 13.9 12/06/2016   HCT 42.1 12/06/2016   MCV 92.1 12/06/2016   PLT 321.0 12/06/2016   Lab Results  Component Value Date   NA 141 12/06/2016   K 3.9 12/06/2016   CL 105 12/06/2016   CO2  31 12/06/2016    Immunization status:  Immunization History  Administered Date(s) Administered   Influenza Split 10/09/2010, 10/30/2011   Influenza Whole 10/04/2009   Influenza,inj,Quad PF,6+ Mos 11/03/2012   Td 02/09/2001   Tdap 01/26/2013   Zoster, Live 01/16/2011     I reviewed prior external note(s) from ENT, primary care  I reviewed the result(s) of the labs and imaging as noted above.   I have ordered   Assessment:  Chronic Cough, like GERD Multiple Pulmonary nodules in a low risk patient  Plan/Recommendations:  I think your symptoms of chronic cough are most likely related to the reflux. Please continue taking the acid reflux medication.  Your nodules are most likely to be benign or not cancerous in appearance. Please make a CD of the images and bring to our office so I can review.   Fleischner Society Guidelines 2017 MacMahon H, Naidich DP, Goo JM, et al. Guidelines for management of incidental pulmonary nodules detected on CT Images: From the Fleischner Society. Radiology 2017; [id number]. Copyright  2017 Radiological Society of [place].  I spent 45 minutes in the care of this patient today including pre-charting, chart review, review of results, face-to-face care, coordination of care and communication with consultants etc.).   Return to Care: Return in about 2 months (around 03/28/2023).  Durel Salts, MD Pulmonary and Critical Care Medicine [facility] Office:[phone]  CC: No ref. provider found

## 2023-02-05 ENCOUNTER — Encounter: Payer: Self-pay | Admitting: Internal Medicine

## 2023-02-13 ENCOUNTER — Institutional Professional Consult (permissible substitution): Payer: PPO | Admitting: Pulmonary Disease

## 2023-02-20 ENCOUNTER — Telehealth: Payer: Self-pay | Admitting: Internal Medicine

## 2023-02-20 NOTE — Telephone Encounter (Signed)
Dr. Celine Mans, patient Terri Pena dropped off a disk at the front desk for you. It is in [place].

## 2023-04-08 ENCOUNTER — Encounter: Payer: Self-pay | Admitting: Internal Medicine

## 2023-04-08 ENCOUNTER — Ambulatory Visit: Payer: PPO | Admitting: Internal Medicine

## 2023-04-08 VITALS — BP 122/80 | HR 73 | Ht 65.0 in | Wt 144.0 lb

## 2023-04-08 DIAGNOSIS — K2100 Gastro-esophageal reflux disease with esophagitis, without bleeding: Secondary | ICD-10-CM

## 2023-04-08 DIAGNOSIS — R918 Other nonspecific abnormal finding of lung field: Secondary | ICD-10-CM | POA: Diagnosis not present

## 2023-04-08 DIAGNOSIS — R053 Chronic cough: Secondary | ICD-10-CM | POA: Diagnosis not present

## 2023-04-08 DIAGNOSIS — K219 Gastro-esophageal reflux disease without esophagitis: Secondary | ICD-10-CM

## 2023-04-08 NOTE — Progress Notes (Signed)
 ZOILA DITULLIO    [id number]    Sep 16, 1955  Primary Care Physician:Lazoff, Jovita Gamma, DO Date of Appointment: 04/08/2023 Established Patient Visit  Chief complaint:   Chief Complaint  Patient presents with   Follow-up    Still coughing in the morning. she states she coughing mucus, yellow in color.     HPI: Terri Pena is a 68 y.o. woman with chronic cough.  Interval Updates: Here for follow up after initiation of PPI for presumed GERD. still coughing with mucus production. Clear - pale yellow - sometimes. Overall it has decreased.   She is still on reflux medicine once daily in the morning. She has backed down to every other day.   Does not cough at all during the night anymore.   Denies sinus pain/pressure, post nasal drainage.  I have reviewed the patient's family social and past medical history and updated as appropriate.   Past Medical History:  Diagnosis Date   Family history of colon cancer 12/06/2016   MGM - q 5 year colonoscopy with Dr. Loreta Ave   Hyperlipidemia    LDL goal = < 160   Postmenopausal disorder    Thyroid nodule 2005   benign follicular nodule , Dr Jamey Ripa    Past Surgical History:  Procedure Laterality Date   CESAREAN SECTION     X 2   COLONOSCOPY  2004 , 2010   negative X 2 , Dr Loreta Ave   GANGLIONECTOMY     THYROID NODULE RESECTED  2005   1 LOBE REMOVED; benign follicular nodule   TONSILLECTOMY      Family History  Problem Relation Age of Onset   ALS Mother    Hyperlipidemia Mother    Alcohol abuse Father    Hyperlipidemia Father    Acromegaly Brother    Colon cancer Maternal Grandmother    Heart attack Maternal Grandmother 6   Prostate cancer Maternal Grandfather    Heart attack Maternal Grandfather 82   Prostate cancer Maternal Uncle        X 3   Diabetes Neg Hx    Stroke Neg Hx    Breast cancer Neg Hx     Social History   Occupational History   Occupation: Real Teacher, early years/pre  Tobacco Use   Smoking status:  Never   Smokeless tobacco: Never  Vaping Use   Vaping status: Never Used  Substance and Sexual Activity   Alcohol use: Yes    Comment: rare   Drug use: No   Sexual activity: Not on file     Physical Exam: Blood pressure 122/80, pulse 73, height 5\' 5"  (1.651 m), weight 144 lb (65.3 kg), SpO2 100%.  Gen:      No acute distress ENT:  no nasal polyps, mucus membranes moist Lungs:    No increased respiratory effort, symmetric chest wall excursion, clear to auscultation bilaterally, no wheezes or crackles CV:         Regular rate and rhythm; no murmurs, rubs, or gallops.  No pedal edema   Data Reviewed: Imaging: I have personally reviewed the ct chest sept 2024 - no emphysema, few sub 4 mm nodules. No air trapping or mosaic attenuation  PFTs:      No data to display         I have personally reviewed the patient's PFTs and   Labs: Lab Results  Component Value Date   NA 141 12/06/2016   K 3.9  12/06/2016   CO2 31 12/06/2016   GLUCOSE 88 12/06/2016   BUN 12 12/06/2016   CREATININE 0.68 12/06/2016   CALCIUM 9.0 12/06/2016   GFR 92.88 12/06/2016   GFRNONAA 96.83 10/04/2009   Lab Results  Component Value Date   WBC 3.9 (L) 12/06/2016   HGB 13.9 12/06/2016   HCT 42.1 12/06/2016   MCV 92.1 12/06/2016   PLT 321.0 12/06/2016    Immunization status: Immunization History  Administered Date(s) Administered   Influenza Split 10/09/2010, 10/30/2011   Influenza Whole 10/04/2009   Influenza,inj,Quad PF,6+ Mos 11/03/2012   Td 02/09/2001   Tdap 01/26/2013   Zoster, Live 01/16/2011    External Records Personally Reviewed: family medicine  Assessment:  Chronic Cough 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  I suspect based on your CT scan and your improvement in symptoms after starting the acid reflux medication that this is most likely all still related to reflux.   For the early morning coughing you can try sleeping a little bit elevated on your sleep number bed to see if increasing the head of the bed 30 degrees may help with the coughing early in the morning.  I do think is worthwhile to follow-up with Dr. Loreta Ave after this to see if she would recommend an upper endoscopy to evaluate for inflammation in your esophagus related to chronic reflux.  Sometimes these can lead to precancerous changes. Referral placed.   I did get a chance to review your CT scan and there are very small pulmonary nodules which are not concerning.  I do not think any further follow-up is related to this because you are low risk patient in your absence of previous cancer history and tobacco use.  I do not think that the nodules are related to your coughing at all.  Please schedule follow up with me as needed.   Return to Care: Return if symptoms worsen or fail to improve.   Durel Salts, MD Pulmonary and Critical Care Medicine [facility] Office:[phone]

## 2023-04-08 NOTE — Patient Instructions (Signed)
 It was a pleasure to see you today!  I am glad that overall the coughing is improving.  I suspect based on your CT scan and your improvement in symptoms after starting the acid reflux medication that this is most likely all still related to reflux.  For the early morning coughing you can try sleeping a little bit elevated on your sleep number bed to see if increasing the head of the bed 30 degrees may help with the coughing early in the morning.  I do think is worthwhile to follow-up with Dr. Loreta Ave after this to see if she would recommend an upper endoscopy to evaluate for inflammation in your esophagus related to chronic reflux.  Sometimes these can lead to precancerous changes.  I did get a chance to review your CT scan and there are very small pulmonary nodules which are not concerning.  I do not think any further follow-up is related to this because you are low risk patient in your absence of previous cancer history and tobacco use.  I do not think that the nodules are related to your coughing at all.  Please schedule follow up with me as needed.    What is GERD? Gastroesophageal reflux disease (GERD) is gastroesophageal reflux diseasewhich occurs when the lower esophageal sphincter (LES) opens spontaneously, for varying periods of time, or does not close properly and stomach contents rise up into the esophagus. GER is also called acid reflux or acid regurgitation, because digestive juices--called acids--rise up with the food. The esophagus is the tube that carries food from the mouth to the stomach. The LES is a ring of muscle at the bottom of the esophagus that acts like a valve between the esophagus and stomach.  When acid reflux occurs, food or fluid can be tasted in the back of the mouth. When refluxed stomach acid touches the lining of the esophagus it may cause a burning sensation in the chest or throat called heartburn or acid indigestion. Occasional reflux is common. Persistent reflux that  occurs more than twice a week is considered GERD, and it can eventually lead to more serious health problems. People of all ages can have GERD. Studies have shown that GERD may worsen or contribute to asthma, chronic cough, and pulmonary fibrosis.   What are the symptoms of GERD? The main symptom of GERD in adults is frequent heartburn, also called acid indigestion--burning-type pain in the lower part of the mid-chest, behind the breast bone, and in the mid-abdomen.  Not all reflux is acidic in nature, and many patients don't have heart burn at all. Sometimes it feels like a cough (either dry or with mucus), choking sensation, asthma, shortness of breath, waking up at night, frequent throat clearing, or trouble swallowing.    What causes GERD? The reason some people develop GERD is still unclear. However, research shows that in people with GERD, the LES relaxes while the rest of the esophagus is working. Anatomical abnormalities such as a hiatal hernia may also contribute to GERD. A hiatal hernia occurs when the upper part of the stomach and the LES move above the diaphragm, the muscle wall that separates the stomach from the chest. Normally, the diaphragm helps the LES keep acid from rising up into the esophagus. When a hiatal hernia is present, acid reflux can occur more easily. A hiatal hernia can occur in people of any age and is most often a normal finding in otherwise healthy people over age 24. Most of the time, a  hiatal hernia produces no symptoms.   Other factors that may contribute to GERD include - Obesity or recent weight gain - Pregnancy  - Smoking  - Diet - Certain medications  Common foods that can worsen reflux symptoms include: - carbonated beverages - artificial sweeteners - citrus fruits  - chocolate  - drinks with caffeine or alcohol  - fatty and fried foods  - garlic and onions  - mint flavorings  - spicy foods  - tomato-based foods, like spaghetti sauce, salsa, chili,  and pizza   Lifestyle Changes If you smoke, stop.  Avoid foods and beverages that worsen symptoms (see above.) Lose weight if needed.  Eat small, frequent meals.  Wear loose-fitting clothes.  Avoid lying down for 3 hours after a meal.  Raise the head of your bed 6 to 8 inches by securing wood blocks under the bedposts. Just using extra pillows will not help, but using a wedge-shaped pillow may be helpful.  Medications  H2 blockers, such as cimetidine (Tagamet HB), famotidine (Pepcid AC), nizatidine (Axid AR), and ranitidine (Zantac 75), decrease acid production. They are available in prescription strength and over-the-counter strength. These drugs provide short-term relief and are effective for about half of those who have GERD symptoms.  Proton pump inhibitors include omeprazole (Prilosec, Zegerid), lansoprazole (Prevacid), pantoprazole (Protonix), rabeprazole (Aciphex), and esomeprazole (Nexium), which are available by prescription. Prilosec is also available in over-the-counter strength. Proton pump inhibitors are more effective than H2 blockers and can relieve symptoms and heal the esophageal lining in almost everyone who has GERD.  Because drugs work in different ways, combinations of medications may help control symptoms. People who get heartburn after eating may take both antacids and H2 blockers. The antacids work first to neutralize the acid in the stomach, and then the H2 blockers act on acid production. By the time the antacid stops working, the H2 blocker will have stopped acid production. Your health care provider is the best source of information about how to use medications for GERD.   Points to Remember 1. You can have GERD without having heartburn. Your symptoms could include a dry cough, asthma symptoms, or trouble swallowing.  2. Taking medications daily as prescribed is important in controlling you symptoms.  Sometimes it can take up to 8 weeks to fully achieve the effects  of the medications prescribed.  3. Coughing related to GERD can be difficult to treat and is very frustrating!  However, it is important to stick with these medications and lifestyle modifications before pursuing more aggressive or invasive test and treatments.

## 2024-01-12 ENCOUNTER — Ambulatory Visit: Payer: Self-pay

## 2024-01-12 NOTE — Telephone Encounter (Signed)
 FYI Only or Action Required?: Action required by provider: update on patient condition.  Patient was last seen in primary care on 11/09/2024.  Called Nurse Triage reporting Hip Pain.  Symptoms began several months ago.  Interventions attempted: Nothing.  Symptoms are: gradually worsening.  Triage Disposition: See PCP When Office is Open (Within 3 Days)  Patient/caregiver understands and will follow disposition?: Yes    Copied from CRM #[id number]. Topic: Clinical - Red Word Triage >> Jan 12, 2024 10:47 AM Pinkey ORN wrote: Red Word that prompted transfer to Nurse Triage: Pain In Left Hip >> Jan 12, 2024 10:54 AM Pinkey ORN wrote: Patient is wanting to schedule with Claudene Hussar - [facility]    Reason for Disposition  [1] MODERATE pain (e.g., interferes with normal activities, limping) AND [2] present > 3 days  Answer Assessment - Initial Assessment Questions Pt called in to request appt with Dr. Darlyn Claudene; unable to schedule for pt but consulted office into call and pt scheduled 02/10.    1. LOCATION and RADIATION: Where is the pain located? Does the pain spread (shoot) anywhere else?     L hip; does not radiate   2. QUALITY: What does the pain feel like?  (e.g., sharp, dull, aching, burning)     Dull; ache   3. SEVERITY: How bad is the pain? What does it keep you from doing?   (Scale 1-10; or mild, moderate, severe)     6/10  4. ONSET: When did the pain start? Does it come and go, or is it there all the time?     6 months ago   5. WORK OR EXERCISE: Has there been any recent work or exercise that involved this part of the body?      No   6. CAUSE: What do you think is causing the hip pain?      Unsure; reports hx of R hip pain   7. AGGRAVATING FACTORS: What makes the hip pain worse? (e.g., walking, climbing stairs, running)     None; pt reports when she first stand up she has to stand and wait 30 seconds then is able to walk  without issue   8. OTHER SYMPTOMS: Do you have any other symptoms? (e.g., back pain, pain shooting down leg,  fever, rash)     None  Protocols used: Hip Pain-A-AH

## 2024-02-13 NOTE — Progress Notes (Unsigned)
 " [facility] [address] [place] [address] Phone: [phone] Subjective:    I'm seeing this patient by the request  of:  Lazoff, Shawn P, DO  CC:   YEP:Dlagzrupcz  Terri Pena is a 69 y.o. female coming in with complaint of L hip pain. Last seen in 2021 for R GT pain. Patient states       Past Medical History:  Diagnosis Date   Family history of colon cancer 12/06/2016   MGM - q 5 year colonoscopy with Dr. Kristie   Hyperlipidemia    LDL goal = < 160   Postmenopausal disorder    Thyroid  nodule 2005   benign follicular nodule , Dr Merrilyn   Past Surgical History:  Procedure Laterality Date   CESAREAN SECTION     X 2   COLONOSCOPY  2004 , 2010   negative X 2 , Dr Kristie   GANGLIONECTOMY     THYROID  NODULE RESECTED  2005   1 LOBE REMOVED; benign follicular nodule   TONSILLECTOMY     Social History   Socioeconomic History   Marital status: Married    Spouse name: Visual merchandiser   Number of children: 2   Years of education: Not on file   Highest education level: Not on file  Occupational History   Occupation: Real Teacher, Early Years/pre  Tobacco Use   Smoking status: Never   Smokeless tobacco: Never  Vaping Use   Vaping status: Never Used  Substance and Sexual Activity   Alcohol use: Yes    Comment: rare   Drug use: No   Sexual activity: Not on file  Other Topics Concern   Not on file  Social History Narrative   Decreased fried foods & sweets   Regular exercise in warmer weather      Social Drivers of Health   Tobacco Use: Low Risk (11/10/2023)   Received from [facility]   Patient History    Smoking Tobacco Use: Never    Smokeless Tobacco Use: Never    Passive Exposure: Never  Financial Resource Strain: Not on file  Food Insecurity: Low Risk (03/04/2023)   Received from [facility]   Epic    Within the past 12 months, you worried that your food would run out before you got money to buy more: Never true    Within the  past 12 months, the food you bought just didn't last and you didn't have money to get more. : Never true  Transportation Needs: No Transportation Needs (03/04/2023)   Received from [facility]    In the past 12 months, has lack of reliable transportation kept you from medical appointments, meetings, work or from getting things needed for daily living? : No  Physical Activity: Not on file  Stress: Not on file  Social Connections: Not on file  Depression (EYV7-0): Not on file  Alcohol Screen: Not on file  Housing: Low Risk (03/04/2023)   Received from [facility]   Epic    What is your living situation today?: I have a steady place to live    Think about the place you live. Do you have problems with any of the following? Choose all that apply:: None/None on this list  Utilities: Low Risk (03/04/2023)   Received from [facility]   Utilities    In the past 12 months has the electric, gas, oil, or water company threatened to shut off services in  your home? : No  Health Literacy: Not on file   Allergies[1] Family History  Problem Relation Age of Onset   ALS Mother    Hyperlipidemia Mother    Alcohol abuse Father    Hyperlipidemia Father    Acromegaly Brother    Colon cancer Maternal Grandmother    Heart attack Maternal Grandmother 5   Prostate cancer Maternal Grandfather    Heart attack Maternal Grandfather 54   Prostate cancer Maternal Uncle        X 3   Diabetes Neg Hx    Stroke Neg Hx    Breast cancer Neg Hx     Current Outpatient Medications (Other):    pantoprazole (PROTONIX) 40 MG tablet, Take 40 mg by mouth daily.   Reviewed prior external information including notes and imaging from  primary care provider As well as notes that were available from care everywhere and other healthcare systems.  Past medical history, social, surgical and family history all reviewed in electronic medical record.  No pertanent information unless stated regarding to  the chief complaint.   Review of Systems:  No headache, visual changes, nausea, vomiting, diarrhea, constipation, dizziness, abdominal pain, skin rash, fevers, chills, night sweats, weight loss, swollen lymph nodes, body aches, joint swelling, chest pain, shortness of breath, mood changes. POSITIVE muscle aches  Objective  There were no vitals taken for this visit.   General: No apparent distress alert and oriented x3 mood and affect normal, dressed appropriately.  HEENT: Pupils equal, extraocular movements intact  Respiratory: Patient's speak in full sentences and does not appear short of breath  Cardiovascular: No lower extremity edema, non tender, no erythema      Impression and Recommendations:           [1]  Allergies Allergen Reactions   Sulfonamide Derivatives Itching and Rash    Rash & itching Because of a history of documented adverse serious drug reaction;Medi Alert bracelet  is recommended   "

## 2024-02-16 ENCOUNTER — Ambulatory Visit: Admitting: Family Medicine
